# Patient Record
Sex: Female | Born: 1990
Health system: Southern US, Community
[De-identification: ages and names within clinical notes are randomized; demographics above are authoritative.]

## PROBLEM LIST (undated history)

## (undated) DIAGNOSIS — F419 Anxiety disorder, unspecified: Secondary | ICD-10-CM

## (undated) DIAGNOSIS — M1990 Unspecified osteoarthritis, unspecified site: Secondary | ICD-10-CM

## (undated) DIAGNOSIS — D649 Anemia, unspecified: Secondary | ICD-10-CM

## (undated) HISTORY — PX: EYE SURGERY: SHX253

## (undated) HISTORY — DX: Anemia, unspecified: D64.9

## (undated) HISTORY — DX: Anxiety disorder, unspecified: F41.9

## (undated) HISTORY — DX: Unspecified osteoarthritis, unspecified site: M19.90

## (undated) HISTORY — PX: CYSTECTOMY: SUR359

---

## 2011-03-19 ENCOUNTER — Emergency Department (HOSPITAL_COMMUNITY)
Admission: EM | Admit: 2011-03-19 | Discharge: 2011-03-19 | Disposition: A | Discharge status: Home / Self Care | Payer: 59 | Attending: Emergency Medicine | Admitting: Emergency Medicine

## 2011-03-19 ENCOUNTER — Encounter: Payer: Self-pay | Admitting: Emergency Medicine

## 2011-03-19 DIAGNOSIS — IMO0002 Reserved for concepts with insufficient information to code with codable children: Secondary | ICD-10-CM | POA: Insufficient documentation

## 2011-03-19 DIAGNOSIS — H5710 Ocular pain, unspecified eye: Secondary | ICD-10-CM

## 2011-03-19 DIAGNOSIS — H53149 Visual discomfort, unspecified: Secondary | ICD-10-CM | POA: Insufficient documentation

## 2011-03-19 DIAGNOSIS — F17200 Nicotine dependence, unspecified, uncomplicated: Secondary | ICD-10-CM | POA: Insufficient documentation

## 2011-03-19 MED ORDER — TETRACAINE HCL 0.5 % OP SOLN
2.0000 [drp] | Freq: Once | OPHTHALMIC | Status: AC
  Administered 2011-03-19: 2 [drp] via OPHTHALMIC
  Filled 2011-03-19: qty 2

## 2011-03-19 MED ORDER — HYDROCODONE-ACETAMINOPHEN 5-325 MG PO TABS: 2.0000 | ORAL_TABLET | ORAL | Status: AC | PRN

## 2011-03-19 MED ORDER — FLUORESCEIN SODIUM 1 MG OP STRP
1.0000 | ORAL_STRIP | Freq: Once | OPHTHALMIC | Status: AC
  Administered 2011-03-19: 02:00:00 via OPHTHALMIC
  Filled 2011-03-19 (×2): qty 1

## 2011-03-19 MED ORDER — TOBRAMYCIN 0.3 % OP SOLN
2.0000 [drp] | OPHTHALMIC | Status: DC
  Administered 2011-03-19: 2 [drp] via OPHTHALMIC
  Filled 2011-03-19: qty 5

## 2011-03-19 MED ORDER — HYDROCODONE-ACETAMINOPHEN 5-325 MG PO TABS
1.0000 | ORAL_TABLET | Freq: Once | ORAL | Status: AC
  Administered 2011-03-19: 1 via ORAL
  Filled 2011-03-19: qty 1

## 2011-03-19 NOTE — ED Provider Notes (Signed)
History     CSN: [id number] Arrival date & time: 03/19/2011 12:35 AM   First MD Initiated Contact with Patient 03/19/11 0111      Chief Complaint  Patient presents with  . Eye Injury     HPI  History provided by the patient. Patient presents with complaints of increased left thigh pain for the past 4 days. Patient states that she was playing with her dog who jumped and hit her left eye with paws. Patient reports having slight initial pain but no real symptoms until the following morning when she woke up. Patient had left her contact lens in overnight and removed in the morning after moving it her I had some swelling and pain. Since that time she has had increased redness and pain to the eye. sHe has photophobia with increased tearing. Patient denies purulent discharge or drainage.   History reviewed. No pertinent past medical history.  History reviewed. No pertinent past surgical history.  No family history on file.  History  Substance Use Topics  . Smoking status: Current Everyday Smoker  . Smokeless tobacco: Not on file  . Alcohol Use: Yes    OB History    Grav Para Term Preterm Abortions TAB SAB Ect Mult Living                  Review of Systems  Constitutional: Negative for fever and chills.  Eyes: Positive for photophobia, pain and redness. Negative for visual disturbance.  All other systems reviewed and are negative.    Allergies  Latex  Home Medications  No current outpatient prescriptions on file.  BP 109/60  Pulse 88  Temp(Src) 99.1 F (37.3 C) (Oral)  Resp 19  SpO2 98%  LMP 03/15/2011  Physical Exam  Nursing note and vitals reviewed. Constitutional: She is oriented to person, place, and time. She appears well-developed and well-nourished. No distress.  HENT:  Head: Normocephalic.  Eyes: Pupils are equal, round, and reactive to light. Left eye exhibits no chemosis. No foreign body present in the left eye. Left conjunctiva is injected. Left  conjunctiva has no hemorrhage. Right eye exhibits normal extraocular motion and no nystagmus. Left eye exhibits normal extraocular motion and no nystagmus.  Slit lamp exam:      The right eye shows no fluorescein uptake.       The left eye shows no corneal abrasion, no corneal ulcer and no hyphema.  Cardiovascular: Normal rate, regular rhythm and normal heart sounds.   Pulmonary/Chest: Effort normal.  Neurological: She is alert and oriented to person, place, and time.  Psychiatric: She has a normal mood and affect. Her behavior is normal.    ED Course  Procedures (including critical care time)    1. Eye pain       MDM  1:20 AM patient seen and evaluated. Patient in no acute distress.        Angus Seller, [place] 03/19/11 [phone]

## 2011-03-19 NOTE — ED Notes (Signed)
MD at bedside. 

## 2011-03-19 NOTE — ED Notes (Signed)
Family at bedside. 

## 2011-03-19 NOTE — ED Provider Notes (Signed)
Medical screening examination/treatment/procedure(s) were performed by non-physician practitioner and as supervising physician I was immediately available for consultation/collaboration.   Hanley Seamen, MD 03/19/11 [phone]

## 2011-03-19 NOTE — ED Notes (Signed)
PT. REPORTS PROGRESSING  LEFT EYE PAIN WITH REDDNESS / TEARY / SWELLING - SCRATCHED BY HER DOG 4 DAYS AGO.

## 2015-08-31 DIAGNOSIS — J029 Acute pharyngitis, unspecified: Secondary | ICD-10-CM | POA: Insufficient documentation

## 2015-08-31 DIAGNOSIS — M67439 Ganglion, unspecified wrist: Secondary | ICD-10-CM | POA: Insufficient documentation

## 2015-09-10 DIAGNOSIS — Z3040 Encounter for surveillance of contraceptives, unspecified: Secondary | ICD-10-CM | POA: Insufficient documentation

## 2015-09-10 DIAGNOSIS — Z0000 Encounter for general adult medical examination without abnormal findings: Secondary | ICD-10-CM | POA: Insufficient documentation

## 2015-09-10 DIAGNOSIS — Z3202 Encounter for pregnancy test, result negative: Secondary | ICD-10-CM | POA: Insufficient documentation

## 2015-09-11 DIAGNOSIS — E559 Vitamin D deficiency, unspecified: Secondary | ICD-10-CM | POA: Insufficient documentation

## 2016-06-29 ENCOUNTER — Emergency Department (HOSPITAL_COMMUNITY)
Admission: EM | Admit: 2016-06-29 | Discharge: 2016-06-29 | Disposition: A | Discharge status: Home / Self Care | Payer: Self-pay | Attending: Emergency Medicine | Admitting: Emergency Medicine

## 2016-06-29 ENCOUNTER — Encounter (HOSPITAL_COMMUNITY): Payer: Self-pay | Admitting: Emergency Medicine

## 2016-06-29 ENCOUNTER — Emergency Department (HOSPITAL_COMMUNITY): Payer: Self-pay

## 2016-06-29 ENCOUNTER — Ambulatory Visit (HOSPITAL_COMMUNITY)
Admission: EM | Admit: 2016-06-29 | Discharge: 2016-06-29 | Disposition: A | Discharge status: Nursing Home (Includes ICF) | Payer: Self-pay | Attending: Emergency Medicine | Admitting: Emergency Medicine

## 2016-06-29 DIAGNOSIS — R1031 Right lower quadrant pain: Secondary | ICD-10-CM | POA: Insufficient documentation

## 2016-06-29 DIAGNOSIS — R109 Unspecified abdominal pain: Secondary | ICD-10-CM | POA: Insufficient documentation

## 2016-06-29 DIAGNOSIS — R509 Fever, unspecified: Secondary | ICD-10-CM | POA: Insufficient documentation

## 2016-06-29 DIAGNOSIS — R1013 Epigastric pain: Secondary | ICD-10-CM | POA: Insufficient documentation

## 2016-06-29 DIAGNOSIS — R1011 Right upper quadrant pain: Secondary | ICD-10-CM | POA: Insufficient documentation

## 2016-06-29 DIAGNOSIS — R197 Diarrhea, unspecified: Secondary | ICD-10-CM | POA: Insufficient documentation

## 2016-06-29 DIAGNOSIS — R112 Nausea with vomiting, unspecified: Secondary | ICD-10-CM | POA: Insufficient documentation

## 2016-06-29 DIAGNOSIS — F17200 Nicotine dependence, unspecified, uncomplicated: Secondary | ICD-10-CM | POA: Insufficient documentation

## 2016-06-29 DIAGNOSIS — Z3202 Encounter for pregnancy test, result negative: Secondary | ICD-10-CM

## 2016-06-29 DIAGNOSIS — K529 Noninfective gastroenteritis and colitis, unspecified: Secondary | ICD-10-CM | POA: Insufficient documentation

## 2016-06-29 DIAGNOSIS — Z91040 Latex allergy status: Secondary | ICD-10-CM | POA: Insufficient documentation

## 2016-06-29 DIAGNOSIS — R630 Anorexia: Secondary | ICD-10-CM | POA: Insufficient documentation

## 2016-06-29 LAB — URINALYSIS, ROUTINE W REFLEX MICROSCOPIC
Bilirubin Urine: NEGATIVE
GLUCOSE, UA: NEGATIVE mg/dL
Ketones, ur: 5 mg/dL — AB
NITRITE: NEGATIVE
PH: 5 (ref 5.0–8.0)
PROTEIN: NEGATIVE mg/dL
SPECIFIC GRAVITY, URINE: 1.025 (ref 1.005–1.030)

## 2016-06-29 LAB — POCT PREGNANCY, URINE: Preg Test, Ur: NEGATIVE

## 2016-06-29 LAB — COMPREHENSIVE METABOLIC PANEL
ALBUMIN: 4.4 g/dL (ref 3.5–5.0)
ALK PHOS: 45 U/L (ref 38–126)
ALT: 22 U/L (ref 14–54)
AST: 27 U/L (ref 15–41)
Anion gap: 9 (ref 5–15)
BILIRUBIN TOTAL: 1.1 mg/dL (ref 0.3–1.2)
BUN: 15 mg/dL (ref 6–20)
CALCIUM: 9 mg/dL (ref 8.9–10.3)
CO2: 26 mmol/L (ref 22–32)
CREATININE: 0.97 mg/dL (ref 0.44–1.00)
Chloride: 103 mmol/L (ref 101–111)
GFR calc Af Amer: >60 mL/min (ref >60)
GFR calc non Af Amer: >60 mL/min (ref >60)
GLUCOSE: 101 mg/dL — AB (ref 65–99)
Potassium: 3.7 mmol/L (ref 3.5–5.1)
SODIUM: 138 mmol/L (ref 135–145)
TOTAL PROTEIN: 7.9 g/dL (ref 6.5–8.1)

## 2016-06-29 LAB — POCT URINALYSIS DIP (DEVICE)
Glucose, UA: NEGATIVE mg/dL
Ketones, ur: 15 mg/dL — AB
NITRITE: NEGATIVE
Protein, ur: 30 mg/dL — AB
SPECIFIC GRAVITY, URINE: 1.025 (ref 1.005–1.030)
UROBILINOGEN UA: 0.2 mg/dL (ref 0.0–1.0)
pH: 6 (ref 5.0–8.0)

## 2016-06-29 LAB — CBC
HCT: 41.9 % (ref 36.0–46.0)
Hemoglobin: 14 g/dL (ref 12.0–15.0)
MCH: 27.9 pg (ref 26.0–34.0)
MCHC: 33.4 g/dL (ref 30.0–36.0)
MCV: 83.5 fL (ref 78.0–100.0)
PLATELETS: 242 10*3/uL (ref 150–400)
RBC: 5.02 MIL/uL (ref 3.87–5.11)
RDW: 14.3 % (ref 11.5–15.5)
WBC: 6.4 10*3/uL (ref 4.0–10.5)

## 2016-06-29 LAB — LIPASE, BLOOD: Lipase: 16 U/L (ref 11–51)

## 2016-06-29 LAB — POC URINE PREG, ED: Preg Test, Ur: NEGATIVE

## 2016-06-29 MED ORDER — ONDANSETRON HCL 4 MG/2ML IJ SOLN
4.0000 mg | Freq: Once | INTRAMUSCULAR | Status: AC
  Administered 2016-06-29: 4 mg via INTRAMUSCULAR

## 2016-06-29 MED ORDER — IOPAMIDOL (ISOVUE-300) INJECTION 61%
INTRAVENOUS | Status: AC
  Administered 2016-06-29: 100 mL
  Filled 2016-06-29: qty 100

## 2016-06-29 MED ORDER — MORPHINE SULFATE (PF) 4 MG/ML IV SOLN
4.0000 mg | Freq: Once | INTRAVENOUS | Status: AC
  Administered 2016-06-29: 4 mg via INTRAVENOUS
  Filled 2016-06-29: qty 1

## 2016-06-29 MED ORDER — ONDANSETRON HCL 4 MG PO TABS: 4.0000 mg | ORAL_TABLET | Freq: Four times a day (QID) | ORAL | 0 refills | Status: DC | PRN

## 2016-06-29 MED ORDER — SODIUM CHLORIDE 0.9 % IV BOLUS (SEPSIS)
1000.0000 mL | Freq: Once | INTRAVENOUS | Status: AC
  Administered 2016-06-29: 1000 mL via INTRAVENOUS

## 2016-06-29 MED ORDER — ONDANSETRON HCL 4 MG/2ML IJ SOLN
INTRAMUSCULAR | Status: AC
  Filled 2016-06-29: qty 2

## 2016-06-29 MED ORDER — ONDANSETRON HCL 4 MG/2ML IJ SOLN
4.0000 mg | Freq: Once | INTRAMUSCULAR | Status: AC
  Administered 2016-06-29: 4 mg via INTRAVENOUS
  Filled 2016-06-29: qty 2

## 2016-06-29 MED ORDER — HYDROCODONE-ACETAMINOPHEN 5-325 MG PO TABS: 1.0000 | ORAL_TABLET | Freq: Four times a day (QID) | ORAL | 0 refills | Status: DC | PRN

## 2016-06-29 NOTE — ED Triage Notes (Signed)
Rt side abd n/v  X 4 days diarrhea also

## 2016-06-29 NOTE — Discharge Instructions (Signed)
Please take nausea medication as prescribed. Drink plenty of fluids and get plenty of rest.   Please return without fail for worsening symptoms, including intractable vomiting, confusion, passing out, persistent fever, or any other symptoms concerning to you

## 2016-06-29 NOTE — ED Notes (Signed)
Patient transported to CT 

## 2016-06-29 NOTE — Discharge Instructions (Signed)
Due to the location of your abdominal pain in the lower right quadrant, along with pain with straight leg raise, I recommend you go to the emergency room as soon as possible for evaluation. I believe you need imaging and we do not have CT or ultrasound.

## 2016-06-29 NOTE — ED Provider Notes (Signed)
[facility] Provider Note   CSN: [id number] Arrival date & time: 06/29/16  1435  By signing my name below, I, Linna Darner, attest that this documentation has been prepared under the direction and in the presence of physician practitioner, Lavera Guise, MD. Electronically Signed: Linna Darner, Scribe. 06/29/2016. 5:23 PM.  History   Chief Complaint Chief Complaint  Patient presents with  . Abdominal Pain  . Emesis    The history is provided by the patient. No language interpreter was used.     HPI Comments: Mallory Nelson is a 26 y.o. female who presents to the Emergency Department complaining of persistent nausea, vomiting, and diarrhea beginning yesterday. She states she ate [place] food around 2 PM yesterday afternoon and developed pressure-like abdominal pain and lower back pain shortly thereafter. Pt reports her abdominal pain has been constant since onset and is most significant on the right side of her abdomen. She states her N/V/D began after her abdominal pain presented and she has been unable to tolerate any PO intake. Pt reports she measured a temperature of 101 yesterday but has not measured an elevated temperature today. No alleviating factors noted. She states a friend who ate with her yesterday afternoon has not experienced any similar symptoms. She started Depo injections in December 2017 and had her second injection on 06/11/16; she states she has been menstruating intermittently for one month and has had some urinary frequency during this time. Pt notes her current abdominal pain is dissimilar to menstrual cramps. Patient was evaluated at an Urgent Care earlier today for the same and was advised to come to the ED for imaging of her abdomen. She denies vaginal discharge, dysuria, or any other associated symptoms.   History reviewed. No pertinent past medical history.  There are no active problems to display for this patient.   History reviewed. No pertinent surgical  history.  OB History    No data available       Home Medications    Prior to Admission medications   Medication Sig Start Date End Date Taking? Authorizing Provider  HYDROcodone-acetaminophen (NORCO/VICODIN) 5-325 MG tablet Take 1 tablet by mouth every 6 (six) hours as needed for moderate pain or severe pain. 06/29/16   Lavera Guise, MD  ondansetron (ZOFRAN) 4 MG tablet Take 1 tablet (4 mg total) by mouth every 6 (six) hours as needed for nausea or vomiting. 06/29/16   Lavera Guise, MD    Family History No family history on file.  Social History Social History  Substance Use Topics  . Smoking status: Current Every Day Smoker  . Smokeless tobacco: Former Neurosurgeon  . Alcohol use Yes     Allergies   Latex   Review of Systems Review of Systems  10/14 systems reviewed and are negative other than those stated in the HPI  Physical Exam Updated Vital Signs BP 105/73   Pulse (!) 104   Temp 99 F (37.2 C) (Oral)   Resp 16   SpO2 100%   Physical Exam Physical Exam  Nursing note and vitals reviewed. Constitutional: Well developed, well nourished, non-toxic, and in no acute distress Head: Normocephalic and atraumatic.  Mouth/Throat: Oropharynx is clear and moist.  Neck: Normal range of motion. Neck supple.  Cardiovascular: Normal rate and regular rhythm.   Pulmonary/Chest: Effort normal and breath sounds normal.  Abdominal: Soft. There is right-sided abdominal tenderness that is worse in the lower quadrant. There is no rebound and no guarding.  Musculoskeletal: Normal range  of motion.  Neurological: Alert, no facial droop, fluent speech, moves all extremities symmetrically Skin: Skin is warm and dry.  Psychiatric: Cooperative  ED Treatments / Results  Labs (all labs ordered are listed, but only abnormal results are displayed) Labs Reviewed  COMPREHENSIVE METABOLIC PANEL - Abnormal; Notable for the following:       Result Value   Glucose, Bld 101 (*)    All other  components within normal limits  URINALYSIS, ROUTINE W REFLEX MICROSCOPIC - Abnormal; Notable for the following:    APPearance HAZY (*)    Hgb urine dipstick LARGE (*)    Ketones, ur 5 (*)    Leukocytes, UA MODERATE (*)    Bacteria, UA RARE (*)    Squamous Epithelial / LPF 0-5 (*)    All other components within normal limits  URINE CULTURE  LIPASE, BLOOD  CBC  POC URINE PREG, ED    EKG  EKG Interpretation None       Radiology Ct Abdomen Pelvis W Contrast  Result Date: 06/29/2016 CLINICAL DATA:  Right-sided abdominal pain with nausea, vomiting, and diarrhea for 4 days. EXAM: CT ABDOMEN AND PELVIS WITH CONTRAST TECHNIQUE: Multidetector CT imaging of the abdomen and pelvis was performed using the standard protocol following bolus administration of intravenous contrast. CONTRAST:  ISOVUE-300 IOPAMIDOL (ISOVUE-300) INJECTION 61% COMPARISON:  None. FINDINGS: Lower chest: The visualized lung bases are clear. Hepatobiliary: No focal liver abnormality is seen. No gallstones, gallbladder wall thickening, or biliary dilatation. Pancreas: Unremarkable. Spleen: Unremarkable. Adrenals/Urinary Tract: Unremarkable adrenal glands. Subcentimeter low-density lesion in the upper pole of the left kidney, too small to fully characterize. No evidence of renal calculi or hydronephrosis. Stomach/Bowel: The stomach is within normal limits. There are multiple fluid-filled small bowel loops in the pelvis without gross bowel wall thickening though with possible mild hyperemia. There is no bowel dilatation to suggest obstruction. Gas and a small amount of liquid stool are present in the colon, which is nondilated. The appendix is unremarkable. Vascular/Lymphatic: No significant vascular findings are present. No enlarged abdominal or pelvic lymph nodes. Reproductive: Unremarkable uterus. Ovaries not well seen due to unopacified bowel loops throughout the pelvis. Other: No intraperitoneal free fluid. No abdominal wall  mass or hernia. Musculoskeletal: No acute osseous abnormality or suspicious osseous lesion. IMPRESSION: 1. Fluid-filled bowel loops in the pelvis, possibly reflecting mild changes of enteritis. No obstruction. 2. No other evidence of acute abnormality. Electronically Signed   By: Sebastian Ache M.D.   On: 06/29/2016 19:31    Procedures Procedures (including critical care time)  DIAGNOSTIC STUDIES: Oxygen Saturation is 100% on RA, normal by my interpretation.    COORDINATION OF CARE: 5:31 PM Discussed treatment plan with pt at bedside and pt agreed to plan.  Medications Ordered in ED Medications  sodium chloride 0.9 % bolus 1,000 mL (1,000 mLs Intravenous New Bag/Given 06/29/16 1809)  ondansetron (ZOFRAN) injection 4 mg (4 mg Intravenous Given 06/29/16 1805)  morphine 4 MG/ML injection 4 mg (4 mg Intravenous Given 06/29/16 1807)  iopamidol (ISOVUE-300) 61 % injection (100 mLs  Contrast Given 06/29/16 1851)     Initial Impression / Assessment and Plan / ED Course  I have reviewed the triage vital signs and the nursing notes.  Pertinent labs & imaging results that were available during my care of the patient were reviewed by me and considered in my medical decision making (see chart for details).     26 year old female who presents with nausea, vomiting, diarrhea with right-sided  abdominal pain. She is nontoxic in no acute distress. With soft and non-peritoneal abdomen, but primarily right-sided abdominal tenderness to palpation.   Differential includes appendicitis, colitis, gastroenteritis. Having intermittent vaginal bleeding since Depo shot which is expected, but doubt pelvic processes given the vomiting/diarrhea. Pain not felt to be in her adnexa to suggest ovarian cyst or torsion.   Blood work reassuring. She is not pregnant. UA with blood consistent with her menses, but no acute infection. CT visualized, does not show any appendicitis. There are findings that could be suggestive of  enteritis. Otherwise, no acute findings.  Feeling improved after supportive management with a tight medics, analgesics, and IV fluids. Tolerating by food and fluids without difficulty. We'll discharge home with antiemetics and pain control. Discussed supportive care. Strict return and follow-up instructions reviewed. She expressed understanding of all discharge instructions and felt comfortable with the plan of care.\    Final Clinical Impressions(s) / ED Diagnoses   Final diagnoses:  Nausea vomiting and diarrhea  Enteritis    New Prescriptions New Prescriptions   HYDROCODONE-ACETAMINOPHEN (NORCO/VICODIN) 5-325 MG TABLET    Take 1 tablet by mouth every 6 (six) hours as needed for moderate pain or severe pain.   ONDANSETRON (ZOFRAN) 4 MG TABLET    Take 1 tablet (4 mg total) by mouth every 6 (six) hours as needed for nausea or vomiting.   I personally performed the services described in this documentation, which was scribed in my presence. The recorded information has been reviewed and is accurate.    Lavera Guise, MD 06/29/16 2036

## 2016-06-29 NOTE — ED Provider Notes (Signed)
CSN: [id number]     Arrival date & time 06/29/16  1326 History   None    Chief Complaint  Patient presents with  . Abdominal Cramping   (Consider location/radiation/quality/duration/timing/severity/associated sxs/prior Treatment) 26 year old female presents to clinic with a chief complaint of abdominal pain, abdominal cramping, nausea, vomiting, diarrhea. Last menstrual cycle was in early March reportedly, she is getting Depo shots for control. She's been unable to eat, unable to hold any food down, she has been trying to drink, however has had multiple bouts of vomiting, and diarrhea. She denies any hematochezia or hematemesis, she does complain of weakness and fatigue, and reportedly had a fever yesterday of 101.   The history is provided by the patient.  Abdominal Cramping  This is a new problem. The current episode started 2 days ago. The problem occurs constantly. The problem has been gradually worsening. Associated symptoms include abdominal pain. Pertinent negatives include no chest pain and no shortness of breath. The symptoms are aggravated by bending, eating and drinking. Nothing relieves the symptoms.  Abdominal Pain  Pain location:  RLQ, RUQ and epigastric Pain quality: aching, cramping, sharp and stabbing   Pain radiates to:  Does not radiate Pain severity:  Severe Onset quality:  Gradual Duration:  2 days Timing:  Constant Progression:  Worsening Chronicity:  New Context: not diet changes, not eating, not recent illness, not sick contacts, not suspicious food intake and not trauma   Relieved by:  Nothing Worsened by:  Eating, movement, position changes and vomiting Ineffective treatments:  Lying down and vomiting Associated symptoms: anorexia, chills, diarrhea, fever, nausea and vomiting   Associated symptoms: no chest pain, no constipation, no cough, no dysuria, no hematemesis, no hematochezia, no hematuria, no melena and no shortness of breath     History reviewed. No  pertinent past medical history. History reviewed. No pertinent surgical history. History reviewed. No pertinent family history. Social History  Substance Use Topics  . Smoking status: Current Every Day Smoker  . Smokeless tobacco: Former Neurosurgeon  . Alcohol use Yes   OB History    No data available     Review of Systems  Constitutional: Positive for chills and fever.  Respiratory: Negative for cough and shortness of breath.   Cardiovascular: Negative for chest pain and palpitations.  Gastrointestinal: Positive for abdominal pain, anorexia, diarrhea, nausea and vomiting. Negative for constipation, hematemesis, hematochezia and melena.  Genitourinary: Negative for dysuria, frequency, hematuria and urgency.  Musculoskeletal: Positive for back pain. Negative for neck pain and neck stiffness.  Neurological: Positive for weakness. Negative for light-headedness and numbness.  All other systems reviewed and are negative.   Allergies  Latex  Home Medications   Prior to Admission medications   Not on File   Meds Ordered and Administered this Visit   Medications  ondansetron (ZOFRAN) injection 4 mg (4 mg Intramuscular Given 06/29/16 1422)    BP 110/74 (BP Location: Right Arm)   Pulse 95   Temp 98.5 F (36.9 C) (Oral)   Resp 16   SpO2 100%  No data found.   Physical Exam  Constitutional: She is oriented to person, place, and time. She appears well-developed and well-nourished. No distress.  HENT:  Head: Normocephalic and atraumatic.  Cardiovascular: Normal rate and regular rhythm.   Pulmonary/Chest: Effort normal and breath sounds normal.  Abdominal: Soft. Normal appearance and bowel sounds are normal. She exhibits no distension. There is no hepatosplenomegaly. There is tenderness in the right upper quadrant, right lower quadrant  and epigastric area. There is no rigidity, no rebound and no guarding.  Pain worsened with straight leg raise  Neurological: She is alert and oriented  to person, place, and time.  Skin: Skin is warm and dry. Capillary refill takes less than 2 seconds. She is not diaphoretic.  Psychiatric: She has a normal mood and affect.  Nursing note and vitals reviewed.   Urgent Care Course     Procedures (including critical care time)  Labs Review Labs Reviewed  POCT URINALYSIS DIP (DEVICE) - Abnormal; Notable for the following:       Result Value   Bilirubin Urine SMALL (*)    Ketones, ur 15 (*)    Hgb urine dipstick LARGE (*)    Protein, ur 30 (*)    Leukocytes, UA TRACE (*)    All other components within normal limits  URINE CULTURE  POCT PREGNANCY, URINE    Imaging Review No results found.     MDM   1. Right lower quadrant abdominal pain    Urine pregnancy test negative, UA significant for leukocytes, hemoglobin, protein, ketones, and bilirubin. Patient denies she is on her period. Recommended further treatment and evaluation in the emergency room based on abdominal exam.      Dorena Bodo, NP 06/29/16 [phone]

## 2016-06-29 NOTE — ED Triage Notes (Signed)
The patient presented to the [facility] with a complaint of abdominal pain with N/V/D x 2 days.

## 2016-06-30 LAB — URINE CULTURE
Culture: 30000 — AB
Culture: NO GROWTH

## 2016-12-09 ENCOUNTER — Ambulatory Visit (HOSPITAL_COMMUNITY)
Admission: EM | Admit: 2016-12-09 | Discharge: 2016-12-09 | Disposition: A | Discharge status: Home / Self Care | Payer: Managed Care, Other (non HMO) | Attending: Family Medicine | Admitting: Family Medicine

## 2016-12-09 ENCOUNTER — Encounter (HOSPITAL_COMMUNITY): Payer: Self-pay | Admitting: Emergency Medicine

## 2016-12-09 DIAGNOSIS — J039 Acute tonsillitis, unspecified: Secondary | ICD-10-CM

## 2016-12-09 MED ORDER — AMOXICILLIN 500 MG PO CAPS: 1000.0000 mg | ORAL_CAPSULE | Freq: Two times a day (BID) | ORAL | 0 refills | Status: DC

## 2016-12-09 NOTE — Discharge Instructions (Signed)
Take the amoxicillin as directed. Ibuprofen 600 mg every 6 hours, Tylenol every 4 hours as needed. Try to drink plenty fluids and stay well-hydrated. This will help with sore throat pain along with Cepacol lozenges and Chloraseptic spray.

## 2016-12-09 NOTE — ED Provider Notes (Signed)
[facility]    CSN: [id number] Arrival date & time: 12/09/16  1527     History   Chief Complaint Chief Complaint  Patient presents with  . Sore Throat    HPI Mallory DunningMonsha Nelson is a 26 y.o. female.   26 year old female complaining of a painful sore throat for about 3 days. It is worse with swallowing drink and food. She says is worse on the left than the right. Denies fever or chills.      History reviewed. No pertinent past medical history.  There are no active problems to display for this patient.   History reviewed. No pertinent surgical history.  OB History    No data available       Home Medications    Prior to Admission medications   Medication Sig Start Date End Date Taking? Authorizing Provider  amoxicillin (AMOXIL) 500 MG capsule Take 2 capsules (1,000 mg total) by mouth 2 (two) times daily. 12/09/16   Hayden RasmussenMabe, , NP  HYDROcodone-acetaminophen (NORCO/VICODIN) 5-325 MG tablet Take 1 tablet by mouth every 6 (six) hours as needed for moderate pain or severe pain. 06/29/16   Lavera GuiseLiu, Dana Duo, MD  ondansetron (ZOFRAN) 4 MG tablet Take 1 tablet (4 mg total) by mouth every 6 (six) hours as needed for nausea or vomiting. 06/29/16   Lavera GuiseLiu, Dana Duo, MD    Family History History reviewed. No pertinent family history.  Social History Social History  Substance Use Topics  . Smoking status: Current Every Day Smoker  . Smokeless tobacco: Former NeurosurgeonUser  . Alcohol use Yes     Allergies   Latex   Review of Systems Review of Systems  Constitutional: Negative.   HENT: Positive for postnasal drip and sore throat. Negative for congestion.   Respiratory: Negative.   Gastrointestinal: Negative.   All other systems reviewed and are negative.    Physical Exam Triage Vital Signs ED Triage Vitals  Enc Vitals Group     BP 12/09/16 1553 106/73     Pulse Rate 12/09/16 1553 100     Resp --      Temp 12/09/16 1553 98.3 F (36.8 C)     Temp Source 12/09/16 1553  Oral     SpO2 12/09/16 1553 100 %     Weight --      Height --      Head Circumference --      Peak Flow --      Pain Score 12/09/16 1554 8     Pain Loc --      Pain Edu? --      Excl. in GC? --    No data found.   Updated Vital Signs BP 106/73 (BP Location: Left Arm)   Pulse 100   Temp 98.3 F (36.8 C) (Oral)   SpO2 100%   Visual Acuity Right Eye Distance:   Left Eye Distance:   Bilateral Distance:    Right Eye Near:   Left Eye Near:    Bilateral Near:     Physical Exam  Constitutional: She is oriented to person, place, and time. She appears well-developed and well-nourished. No distress.  HENT:  Oropharynx with erythema, small palatine tonsils left larger than right both erythematous with exudate  Eyes: EOM are normal.  Neck: Normal range of motion. Neck supple.  Cardiovascular: Normal rate and regular rhythm.   Pulmonary/Chest: Effort normal.  Musculoskeletal: Normal range of motion.  Neurological: She is alert and oriented to person, place, and time.  Skin: Skin is warm and dry.  Nursing note and vitals reviewed.    UC Treatments / Results  Labs (all labs ordered are listed, but only abnormal results are displayed) Labs Reviewed - No data to display  EKG  EKG Interpretation None       Radiology No results found.  Procedures Procedures (including critical care time)  Medications Ordered in UC Medications - No data to display   Initial Impression / Assessment and Plan / UC Course  I have reviewed the triage vital signs and the nursing notes.  Pertinent labs & imaging results that were available during my care of the patient were reviewed by me and considered in my medical decision making (see chart for details).    Take the amoxicillin as directed. Ibuprofen 600 mg every 6 hours, Tylenol every 4 hours as needed. Try to drink plenty fluids and stay well-hydrated. This will help with sore throat pain along with Cepacol lozenges and Chloraseptic  spray.     Final Clinical Impressions(s) / UC Diagnoses   Final diagnoses:  Exudative tonsillitis    New Prescriptions New Prescriptions   AMOXICILLIN (AMOXIL) 500 MG CAPSULE    Take 2 capsules (1,000 mg total) by mouth 2 (two) times daily.     Controlled Substance Prescriptions [place] Controlled Substance Registry consulted? Not Applicable   Hayden Rasmussen, NP 12/09/16 1630

## 2016-12-09 NOTE — ED Triage Notes (Signed)
Pt reports a sore throat for the last three days.  She states her lymph nodes are swollen, she is unable to eat or drink anything because of the pain with swallowing.

## 2018-04-28 ENCOUNTER — Other Ambulatory Visit: Payer: Self-pay

## 2018-04-28 ENCOUNTER — Emergency Department (HOSPITAL_COMMUNITY)
Admission: EM | Admit: 2018-04-28 | Discharge: 2018-04-29 | Disposition: A | Discharge status: Home / Self Care | Payer: BLUE CROSS/BLUE SHIELD | Attending: Emergency Medicine | Admitting: Emergency Medicine

## 2018-04-28 ENCOUNTER — Encounter (HOSPITAL_COMMUNITY): Payer: Self-pay | Admitting: Emergency Medicine

## 2018-04-28 DIAGNOSIS — S0502XA Injury of conjunctiva and corneal abrasion without foreign body, left eye, initial encounter: Secondary | ICD-10-CM | POA: Insufficient documentation

## 2018-04-28 DIAGNOSIS — T1592XA Foreign body on external eye, part unspecified, left eye, initial encounter: Secondary | ICD-10-CM

## 2018-04-28 DIAGNOSIS — Y92524 Gas station as the place of occurrence of the external cause: Secondary | ICD-10-CM | POA: Insufficient documentation

## 2018-04-28 DIAGNOSIS — Z79899 Other long term (current) drug therapy: Secondary | ICD-10-CM | POA: Diagnosis not present

## 2018-04-28 DIAGNOSIS — F17210 Nicotine dependence, cigarettes, uncomplicated: Secondary | ICD-10-CM | POA: Insufficient documentation

## 2018-04-28 DIAGNOSIS — Y939 Activity, unspecified: Secondary | ICD-10-CM | POA: Diagnosis not present

## 2018-04-28 DIAGNOSIS — W25XXXA Contact with sharp glass, initial encounter: Secondary | ICD-10-CM | POA: Insufficient documentation

## 2018-04-28 DIAGNOSIS — Y999 Unspecified external cause status: Secondary | ICD-10-CM | POA: Diagnosis not present

## 2018-04-28 DIAGNOSIS — H1132 Conjunctival hemorrhage, left eye: Secondary | ICD-10-CM

## 2018-04-28 MED ORDER — TETRACAINE HCL 0.5 % OP SOLN
2.0000 [drp] | Freq: Once | OPHTHALMIC | Status: AC
  Administered 2018-04-28: 2 [drp] via OPHTHALMIC
  Filled 2018-04-28: qty 4

## 2018-04-28 MED ORDER — OXYCODONE-ACETAMINOPHEN 5-325 MG PO TABS
1.0000 | ORAL_TABLET | Freq: Once | ORAL | Status: AC
  Administered 2018-04-28: 1 via ORAL
  Filled 2018-04-28: qty 1

## 2018-04-28 MED ORDER — FLUORESCEIN SODIUM 1 MG OP STRP
1.0000 | ORAL_STRIP | Freq: Once | OPHTHALMIC | Status: AC
  Administered 2018-04-28: 1 via OPHTHALMIC
  Filled 2018-04-28: qty 1
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
## 2018-04-29 ENCOUNTER — Emergency Department (HOSPITAL_COMMUNITY): Payer: BLUE CROSS/BLUE SHIELD

## 2018-04-29 MED ORDER — OXYCODONE-ACETAMINOPHEN 5-325 MG PO TABS: 1.0000 | ORAL_TABLET | Freq: Three times a day (TID) | ORAL | 0 refills | Status: DC | PRN

## 2018-04-29 MED ORDER — ERYTHROMYCIN 5 MG/GM OP OINT
1.0000 "application " | TOPICAL_OINTMENT | Freq: Once | OPHTHALMIC | Status: AC
  Administered 2018-04-29: 1 via OPHTHALMIC
  Filled 2018-04-29: qty 3.5

## 2018-04-29 NOTE — Progress Notes (Signed)
Attempted to call pt x 2 for pre-op call. Each time got her voicemail. Left pre-op instructions on pt's voicemail. I also called and spoke with pt's mother, Lesia Sago asking her to have pt listen to voicemail. She states she would make sure pt did so.

## 2018-04-29 NOTE — Anesthesia Preprocedure Evaluation (Addendum)
Anesthesia Evaluation  Patient identified by MRN, date of birth, ID band Patient awake    Reviewed: Allergy & Precautions, H&P , NPO status , Patient's Chart, lab work & pertinent test results, reviewed documented beta blocker date and time   Airway Mallampati: I  TM Distance: >3 FB Neck ROM: full    Dental no notable dental hx. (+) Teeth Intact   Pulmonary neg pulmonary ROS, Current Smoker,    Pulmonary exam normal breath sounds clear to auscultation       Cardiovascular Exercise Tolerance: Good negative cardio ROS   Rhythm:regular Rate:Normal     Neuro/Psych negative neurological ROS  negative psych ROS   GI/Hepatic negative GI ROS, Neg liver ROS,   Endo/Other  negative endocrine ROS  Renal/GU negative Renal ROS  negative genitourinary   Musculoskeletal   Abdominal   Peds  Hematology negative hematology ROS (+)   Anesthesia Other Findings   Reproductive/Obstetrics negative OB ROS                            Anesthesia Physical Anesthesia Plan  ASA: II  Anesthesia Plan: General   Post-op Pain Management:    Induction: Intravenous  PONV Risk Score and Plan: 3 and Ondansetron and Treatment may vary due to age or medical condition  Airway Management Planned: Oral ETT  Additional Equipment:   Intra-op Plan:   Post-operative Plan: Extubation in OR  Informed Consent: I have reviewed the patients History and Physical, chart, labs and discussed the procedure including the risks, benefits and alternatives for the proposed anesthesia with the patient or authorized representative who has indicated his/her understanding and acceptance.     Dental Advisory Given  Plan Discussed with: CRNA, Anesthesiologist and Surgeon  Anesthesia Plan Comments: (  )        Anesthesia Quick Evaluation

## 2018-04-29 NOTE — Discharge Instructions (Addendum)
Thank you for allowing me to care for you today in the Emergency Department.   Apply a 0.5 inch ribbon of erythromycin ointment to the lower eyelashes every 6 hours while you are awake until Dr. Allena Katz tells you to stop the medication.  Someone from his office will be calling you tomorrow to set up a time to get the glass removed from your eye.  For mild to moderate pain, take 600 mg of ibuprofen with food or 650 mg of Tylenol once every 6 hours.  You can alternate between these 2 medications every 3 hours.  For severe pain, take 1 tablet of Percocet every 8 hours.  This is a narcotic and can be addicting.  Please only use for severe pain.  Please do not work, drive, or take other substances that may make you sleepy while you are taking this medication.  I strongly encourage you not to go to work tomorrow since you have an open cut on your eye to prevent infection.  However, if it is unavoidable, please wear the patch on your eye that you were provided tonight in the ER.  Return to the emergency department if you develop thick, mucus-like drainage from the eye, if you lose vision in the eye, if you have a new or worsening injury, develop a high fever, or other new, concerning symptoms.

## 2018-04-30 ENCOUNTER — Other Ambulatory Visit: Payer: Self-pay

## 2018-04-30 ENCOUNTER — Ambulatory Visit (HOSPITAL_COMMUNITY)
Admission: RE | Admit: 2018-04-30 | Discharge: 2018-04-30 | Disposition: A | Discharge status: Home / Self Care | Payer: BLUE CROSS/BLUE SHIELD | Attending: Ophthalmology | Admitting: Ophthalmology

## 2018-04-30 ENCOUNTER — Encounter (HOSPITAL_COMMUNITY)
Admission: RE | Disposition: A | Discharge status: Home / Self Care | Payer: Self-pay | Source: Home / Self Care | Attending: Ophthalmology

## 2018-04-30 ENCOUNTER — Ambulatory Visit (HOSPITAL_COMMUNITY): Payer: BLUE CROSS/BLUE SHIELD | Admitting: Anesthesiology

## 2018-04-30 ENCOUNTER — Encounter (HOSPITAL_COMMUNITY): Payer: Self-pay

## 2018-04-30 DIAGNOSIS — W25XXXA Contact with sharp glass, initial encounter: Secondary | ICD-10-CM | POA: Diagnosis not present

## 2018-04-30 DIAGNOSIS — S0532XA Ocular laceration without prolapse or loss of intraocular tissue, left eye, initial encounter: Secondary | ICD-10-CM | POA: Diagnosis not present

## 2018-04-30 DIAGNOSIS — F17200 Nicotine dependence, unspecified, uncomplicated: Secondary | ICD-10-CM | POA: Insufficient documentation

## 2018-04-30 DIAGNOSIS — T1592XA Foreign body on external eye, part unspecified, left eye, initial encounter: Secondary | ICD-10-CM | POA: Insufficient documentation

## 2018-04-30 HISTORY — PX: EYE EXAMINATION UNDER ANESTHESIA: SHX1560

## 2018-04-30 LAB — CBC
HCT: 41.5 % (ref 36.0–46.0)
Hemoglobin: 12.9 g/dL (ref 12.0–15.0)
MCH: 26.9 pg (ref 26.0–34.0)
MCHC: 31.1 g/dL (ref 30.0–36.0)
MCV: 86.6 fL (ref 80.0–100.0)
Platelets: 235 10*3/uL (ref 150–400)
RBC: 4.79 MIL/uL (ref 3.87–5.11)
RDW: 14.3 % (ref 11.5–15.5)
WBC: 5.4 10*3/uL (ref 4.0–10.5)
nRBC: 0 % (ref 0.0–0.2)

## 2018-04-30 LAB — COMPREHENSIVE METABOLIC PANEL
ALT: 15 U/L (ref 0–44)
AST: 17 U/L (ref 15–41)
Albumin: 4.1 g/dL (ref 3.5–5.0)
Alkaline Phosphatase: 46 U/L (ref 38–126)
Anion gap: 10 (ref 5–15)
BUN: 14 mg/dL (ref 6–20)
CO2: 25 mmol/L (ref 22–32)
Calcium: 9 mg/dL (ref 8.9–10.3)
Chloride: 106 mmol/L (ref 98–111)
Creatinine, Ser: 0.88 mg/dL (ref 0.44–1.00)
Glucose, Bld: 105 mg/dL — ABNORMAL HIGH (ref 70–99)
Potassium: 4.4 mmol/L (ref 3.5–5.1)
Sodium: 141 mmol/L (ref 135–145)
Total Bilirubin: 0.5 mg/dL (ref 0.3–1.2)
Total Protein: 7.2 g/dL (ref 6.5–8.1)

## 2018-04-30 LAB — POCT PREGNANCY, URINE: Preg Test, Ur: NEGATIVE

## 2018-04-30 SURGERY — EXAM UNDER ANESTHESIA, EYE
Anesthesia: General | Site: Eye | Laterality: Bilateral

## 2018-04-30 MED ORDER — OXYCODONE HCL 5 MG PO TABS
ORAL_TABLET | ORAL | Status: AC
  Filled 2018-04-30: qty 1

## 2018-04-30 MED ORDER — FENTANYL CITRATE (PF) 250 MCG/5ML IJ SOLN
INTRAMUSCULAR | Status: DC | PRN
  Administered 2018-04-30 (×2): 50 ug via INTRAVENOUS
  Administered 2018-04-30: 25 ug via INTRAVENOUS

## 2018-04-30 MED ORDER — DEXAMETHASONE SODIUM PHOSPHATE 10 MG/ML IJ SOLN
INTRAMUSCULAR | Status: AC
  Filled 2018-04-30: qty 1

## 2018-04-30 MED ORDER — BSS IO SOLN
INTRAOCULAR | Status: AC
  Filled 2018-04-30: qty 15

## 2018-04-30 MED ORDER — BSS PLUS IO SOLN
INTRAOCULAR | Status: AC
  Filled 2018-04-30: qty 500

## 2018-04-30 MED ORDER — OXYCODONE HCL 5 MG/5ML PO SOLN: 5.0000 mg | Freq: Once | ORAL | Status: AC | PRN

## 2018-04-30 MED ORDER — MIDAZOLAM HCL 2 MG/2ML IJ SOLN
INTRAMUSCULAR | Status: DC | PRN
  Administered 2018-04-30: 2 mg via INTRAVENOUS

## 2018-04-30 MED ORDER — MEPERIDINE HCL 50 MG/ML IJ SOLN: 6.2500 mg | INTRAMUSCULAR | Status: DC | PRN

## 2018-04-30 MED ORDER — ACETAMINOPHEN 325 MG PO TABS
ORAL_TABLET | ORAL | Status: AC
  Filled 2018-04-30: qty 2

## 2018-04-30 MED ORDER — LACTATED RINGERS IV SOLN
INTRAVENOUS | Status: DC | PRN
  Administered 2018-04-30: 07:00:00 via INTRAVENOUS

## 2018-04-30 MED ORDER — PHENYLEPHRINE HCL 2.5 % OP SOLN
1.0000 [drp] | OPHTHALMIC | Status: AC | PRN
  Administered 2018-04-30 (×3): 1 [drp] via OPHTHALMIC
  Filled 2018-04-30: qty 2

## 2018-04-30 MED ORDER — ROCURONIUM BROMIDE 50 MG/5ML IV SOSY
PREFILLED_SYRINGE | INTRAVENOUS | Status: AC
  Filled 2018-04-30: qty 5

## 2018-04-30 MED ORDER — DEXAMETHASONE SODIUM PHOSPHATE 10 MG/ML IJ SOLN
INTRAMUSCULAR | Status: DC | PRN
  Administered 2018-04-30: 10 mg

## 2018-04-30 MED ORDER — HYALURONIDASE HUMAN 150 UNIT/ML IJ SOLN
INTRAMUSCULAR | Status: AC
  Filled 2018-04-30: qty 1

## 2018-04-30 MED ORDER — LIDOCAINE HCL 2 % IJ SOLN
INTRAMUSCULAR | Status: AC
  Filled 2018-04-30: qty 20

## 2018-04-30 MED ORDER — FENTANYL CITRATE (PF) 100 MCG/2ML IJ SOLN: 25.0000 ug | INTRAMUSCULAR | Status: DC | PRN

## 2018-04-30 MED ORDER — LIDOCAINE 2% (20 MG/ML) 5 ML SYRINGE
INTRAMUSCULAR | Status: DC | PRN
  Administered 2018-04-30: 60 mg via INTRAVENOUS

## 2018-04-30 MED ORDER — 0.9 % SODIUM CHLORIDE (POUR BTL) OPTIME
TOPICAL | Status: DC | PRN
  Administered 2018-04-30: 200 mL

## 2018-04-30 MED ORDER — ACETAMINOPHEN 160 MG/5ML PO SOLN: 325.0000 mg | ORAL | Status: DC | PRN

## 2018-04-30 MED ORDER — PROPARACAINE HCL 0.5 % OP SOLN
1.0000 [drp] | OPHTHALMIC | Status: AC | PRN
  Administered 2018-04-30 (×3): 1 [drp] via OPHTHALMIC
  Filled 2018-04-30: qty 15

## 2018-04-30 MED ORDER — LIDOCAINE 2% (20 MG/ML) 5 ML SYRINGE
INTRAMUSCULAR | Status: AC
  Filled 2018-04-30: qty 5

## 2018-04-30 MED ORDER — PROPOFOL 10 MG/ML IV BOLUS
INTRAVENOUS | Status: DC | PRN
  Administered 2018-04-30: 160 mg via INTRAVENOUS
  Administered 2018-04-30: 20 mg via INTRAVENOUS

## 2018-04-30 MED ORDER — TETRACAINE HCL 0.5 % OP SOLN
OPHTHALMIC | Status: AC
  Filled 2018-04-30: qty 4

## 2018-04-30 MED ORDER — ONDANSETRON HCL 4 MG/2ML IJ SOLN
INTRAMUSCULAR | Status: DC | PRN
  Administered 2018-04-30: 4 mg via INTRAVENOUS

## 2018-04-30 MED ORDER — TOBRAMYCIN-DEXAMETHASONE 0.3-0.1 % OP OINT
TOPICAL_OINTMENT | OPHTHALMIC | Status: AC
  Filled 2018-04-30: qty 3.5

## 2018-04-30 MED ORDER — OXYCODONE HCL 5 MG PO TABS
5.0000 mg | ORAL_TABLET | Freq: Once | ORAL | Status: AC | PRN
  Administered 2018-04-30: 5 mg via ORAL

## 2018-04-30 MED ORDER — BSS IO SOLN
INTRAOCULAR | Status: DC | PRN
  Administered 2018-04-30: 15 mL via INTRAOCULAR

## 2018-04-30 MED ORDER — ACETAMINOPHEN 325 MG PO TABS
325.0000 mg | ORAL_TABLET | ORAL | Status: DC | PRN
  Administered 2018-04-30: 650 mg via ORAL

## 2018-04-30 MED ORDER — MIDAZOLAM HCL 2 MG/2ML IJ SOLN
INTRAMUSCULAR | Status: AC
  Filled 2018-04-30: qty 2

## 2018-04-30 MED ORDER — PROPOFOL 10 MG/ML IV BOLUS
INTRAVENOUS | Status: AC
  Filled 2018-04-30: qty 20

## 2018-04-30 MED ORDER — OFLOXACIN 0.3 % OP SOLN
1.0000 [drp] | OPHTHALMIC | Status: AC | PRN
  Administered 2018-04-30 (×3): 1 [drp] via OPHTHALMIC
  Filled 2018-04-30: qty 5

## 2018-04-30 MED ORDER — FENTANYL CITRATE (PF) 250 MCG/5ML IJ SOLN
INTRAMUSCULAR | Status: AC
  Filled 2018-04-30: qty 5

## 2018-04-30 MED ORDER — CYCLOPENTOLATE HCL 1 % OP SOLN
1.0000 [drp] | OPHTHALMIC | Status: AC | PRN
  Administered 2018-04-30 (×3): 1 [drp] via OPHTHALMIC
  Filled 2018-04-30: qty 2

## 2018-04-30 MED ORDER — ONDANSETRON HCL 4 MG/2ML IJ SOLN: 4.0000 mg | Freq: Once | INTRAMUSCULAR | Status: DC | PRN

## 2018-04-30 MED ORDER — BUPIVACAINE HCL (PF) 0.75 % IJ SOLN
INTRAMUSCULAR | Status: AC
  Filled 2018-04-30: qty 10

## 2018-04-30 MED ORDER — ATROPINE SULFATE 1 % OP SOLN
OPHTHALMIC | Status: AC
  Filled 2018-04-30: qty 5

## 2018-04-30 MED ORDER — TOBRAMYCIN-DEXAMETHASONE 0.3-0.1 % OP SUSP
OPHTHALMIC | Status: DC | PRN
  Administered 2018-04-30: 2 [drp] via OTIC

## 2018-04-30 MED ORDER — STERILE WATER FOR IRRIGATION IR SOLN
Status: DC | PRN
  Administered 2018-04-30: 200 mL

## 2018-04-30 MED ORDER — TETRACAINE 0.5 % OP SOLN OPTIME - NO CHARGE
OPHTHALMIC | Status: DC | PRN
  Administered 2018-04-30: 2 [drp] via OPHTHALMIC

## 2018-04-30 MED ORDER — DEXAMETHASONE SODIUM PHOSPHATE 10 MG/ML IJ SOLN
INTRAMUSCULAR | Status: DC | PRN
  Administered 2018-04-30: 5 mg via INTRAVENOUS

## 2018-04-30 MED ORDER — LIDOCAINE HCL 2 % IJ SOLN
INTRAMUSCULAR | Status: DC | PRN
  Administered 2018-04-30: 20 mL

## 2018-04-30 MED ORDER — GENTAMICIN SUBCONJUNCTIVAL INJECTION 40 MG / ML KALEIDOSCOPE
INTRAVITREAL | Status: AC
  Administered 2018-04-30: 20 mg via SUBCONJUNCTIVAL
  Filled 2018-04-30: qty 2

## 2018-04-30 MED ORDER — PHENYLEPHRINE 40 MCG/ML (10ML) SYRINGE FOR IV PUSH (FOR BLOOD PRESSURE SUPPORT)
PREFILLED_SYRINGE | INTRAVENOUS | Status: DC | PRN
  Administered 2018-04-30 (×2): 40 ug via INTRAVENOUS

## 2018-04-30 MED ORDER — EPINEPHRINE PF 1 MG/ML IJ SOLN
INTRAMUSCULAR | Status: AC
  Filled 2018-04-30: qty 1

## 2018-04-30 MED ORDER — ONDANSETRON HCL 4 MG/2ML IJ SOLN
INTRAMUSCULAR | Status: AC
  Filled 2018-04-30: qty 2

## 2018-04-30 SURGICAL SUPPLY — 35 items
APPLICATOR DR MATTHEWS STRL (MISCELLANEOUS) IMPLANT
BAG ISOLATION DRAPE 18X18 (DRAPES) IMPLANT
BLADE EYE CATARACT 19 1.4 BEAV (BLADE) IMPLANT
BLADE MVR KNIFE 20G (BLADE) IMPLANT
CAUTERY EYE LOW TEMP 1300F FIN (OPHTHALMIC RELATED) IMPLANT
CLOSURE WOUND 1/2 X4 (GAUZE/BANDAGES/DRESSINGS) ×1
CORDS BIPOLAR (ELECTRODE) IMPLANT
COVER SURGICAL LIGHT HANDLE (MISCELLANEOUS) IMPLANT
COVER WAND RF STERILE (DRAPES) IMPLANT
DRAPE ISOLATION BAG 18X18 (DRAPES)
DRAPE OPHTHALMIC 40X48 W POUCH (DRAPES) ×3 IMPLANT
ERASER HMR WETFIELD 23G BP (MISCELLANEOUS) IMPLANT
GLOVE BIOGEL PI IND STRL 7.0 (GLOVE) ×1 IMPLANT
GLOVE BIOGEL PI IND STRL 8 (GLOVE) ×1 IMPLANT
GLOVE BIOGEL PI INDICATOR 7.0 (GLOVE) ×2
GLOVE BIOGEL PI INDICATOR 8 (GLOVE) ×2
GLOVE ECLIPSE 7.5 STRL STRAW (GLOVE) IMPLANT
GLOVE SURG SS PI 7.0 STRL IVOR (GLOVE) ×3 IMPLANT
GOWN STRL REUS W/ TWL LRG LVL3 (GOWN DISPOSABLE) ×2 IMPLANT
GOWN STRL REUS W/TWL LRG LVL3 (GOWN DISPOSABLE) ×4
KIT BASIN OR (CUSTOM PROCEDURE TRAY) ×3 IMPLANT
LENS BIOM SUPER VIEW SET DISP (OPHTHALMIC RELATED) IMPLANT
NS IRRIG 1000ML POUR BTL (IV SOLUTION) ×3 IMPLANT
PACK CATARACT CUSTOM (CUSTOM PROCEDURE TRAY) ×3 IMPLANT
PAD ARMBOARD 7.5X6 YLW CONV (MISCELLANEOUS) ×3 IMPLANT
ROLLS DENTAL (MISCELLANEOUS) IMPLANT
SOLUTION ANTI FOG 6CC (MISCELLANEOUS) IMPLANT
SPEAR EYE SURG WECK-CEL (MISCELLANEOUS) IMPLANT
SPECIMEN JAR SMALL (MISCELLANEOUS) IMPLANT
STRIP CLOSURE SKIN 1/2X4 (GAUZE/BANDAGES/DRESSINGS) ×2 IMPLANT
SUT CHROMIC 7 0 TG140 8 (SUTURE) ×3 IMPLANT
SUT ETHILON 10 0 CS140 6 (SUTURE) IMPLANT
SUT ETHILON 9 0 TG140 8 (SUTURE) IMPLANT
TOWEL OR 17X24 6PK STRL BLUE (TOWEL DISPOSABLE) ×3 IMPLANT
WATER STERILE IRR 1000ML POUR (IV SOLUTION) ×3 IMPLANT
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
## 2018-04-30 NOTE — Transfer of Care (Signed)
Immediate Anesthesia Transfer of Care Note  Patient: Mallory Nelson  Procedure(s) Performed: GLOBE EXPLORATION , REMOVAL OF FOREIGN BODY (GLASS) (Bilateral )  Patient Location: PACU  Anesthesia Type:General  Level of Consciousness: drowsy  Airway & Oxygen Therapy: Patient Spontanous Breathing and Patient connected to nasal cannula oxygen  Post-op Assessment: Report given to RN and Post -op Vital signs reviewed and stable  Post vital signs: Reviewed and stable  Last Vitals:  Vitals Value Taken Time  BP 94/65 04/30/2018  8:29 AM  Temp    Pulse 91 04/30/2018  8:30 AM  Resp 20 04/30/2018  8:30 AM  SpO2 99 % 04/30/2018  8:30 AM  Vitals shown include unvalidated device data.  Last Pain:  Vitals:   04/30/18 0649  TempSrc:   PainSc: 7          Complications: No apparent anesthesia complications

## 2018-04-30 NOTE — Anesthesia Postprocedure Evaluation (Signed)
Anesthesia Post Note  Patient: Chiropractor  Procedure(s) Performed: GLOBE EXPLORATION , REMOVAL OF FOREIGN BODY (GLASS) (Bilateral Eye)     Patient location during evaluation: PACU Anesthesia Type: General Level of consciousness: awake and alert Pain management: pain level controlled Vital Signs Assessment: post-procedure vital signs reviewed and stable Respiratory status: spontaneous breathing, nonlabored ventilation, respiratory function stable and patient connected to nasal cannula oxygen Cardiovascular status: blood pressure returned to baseline and stable Postop Assessment: no apparent nausea or vomiting Anesthetic complications: no    Last Vitals:  Vitals:   04/30/18 0836 04/30/18 0845  BP: 119/78 117/78  Pulse: 94 82  Resp: 16 15  Temp:    SpO2: 99% 100%    Last Pain:  Vitals:   04/30/18 0830  TempSrc:   PainSc: Asleep                 ,

## 2018-04-30 NOTE — Anesthesia Procedure Notes (Signed)
Procedure Name: LMA Insertion Date/Time: 04/30/2018 7:41 AM Performed by: Alvera NovelPike,  H, CRNA Pre-anesthesia Checklist: Patient identified, Emergency Drugs available, Suction available and Patient being monitored Patient Re-evaluated:Patient Re-evaluated prior to induction Oxygen Delivery Method: Circle System Utilized Preoxygenation: Pre-oxygenation with 100% oxygen Induction Type: IV induction Ventilation: Mask ventilation without difficulty LMA: LMA inserted LMA Size: 4.0 Number of attempts: 1 Placement Confirmation: positive ETCO2 Tube secured with: Tape Dental Injury: Teeth and Oropharynx as per pre-operative assessment
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
## 2018-04-30 NOTE — Discharge Instructions (Addendum)
Use ointment in left eye twice a day for one week and in right eye twice a day for 1-2 days.

## 2018-05-03 ENCOUNTER — Encounter (HOSPITAL_COMMUNITY): Payer: Self-pay | Admitting: Ophthalmology

## 2018-05-03 DIAGNOSIS — Z3042 Encounter for surveillance of injectable contraceptive: Secondary | ICD-10-CM | POA: Diagnosis not present

## 2018-05-03 DIAGNOSIS — N760 Acute vaginitis: Secondary | ICD-10-CM | POA: Diagnosis not present

## 2018-05-03 DIAGNOSIS — Z113 Encounter for screening for infections with a predominantly sexual mode of transmission: Secondary | ICD-10-CM | POA: Diagnosis not present

## 2018-05-05 NOTE — Op Note (Signed)
Zisel Lovelace 05/05/2018 Diagnosis: embedded foreign body left eye  Procedure: Left eye globe exploration and repair of globe laceration left eye and removal of embedded foreign body left eye Operative Eye:  left eye  Surgeon: Harrold Donath Estimated Blood Loss: minimal Specimens for Pathology:  None Complications: none   The  patient was prepped and draped in the usual fashion for ocular surgery on the  left eye .  A lid speculum was placed.  A localized peritomy was performed.  The site of laceration was explored and a glass foreign body was removed.  A partial thickness scleral laceration was noted. The laceration and overlying tenon's was closed with 7-0 vicryl while the overlying conjunctiva was closed with 5-0 chromic.  Retinal exam did not reveal any penetration in the globe  The speculum and drapes were removed and the eye was patched with Polymixin/Bacitracin ophthalmic ointment. An eye shield was placed and the patient was transferred alert and conversant with stable vital signs to the post operative recovery area.  The patient tolerated the procedure well and no complications were noted.  Harrold Donath MD

## 2018-10-10 ENCOUNTER — Ambulatory Visit (HOSPITAL_COMMUNITY)
Admission: EM | Admit: 2018-10-10 | Discharge: 2018-10-10 | Disposition: A | Discharge status: Home / Self Care | Payer: BLUE CROSS/BLUE SHIELD | Attending: Physician Assistant | Admitting: Physician Assistant

## 2018-10-10 ENCOUNTER — Other Ambulatory Visit: Payer: Self-pay

## 2018-10-10 ENCOUNTER — Encounter (HOSPITAL_COMMUNITY): Payer: Self-pay | Admitting: Emergency Medicine

## 2018-10-10 DIAGNOSIS — Z20822 Contact with and (suspected) exposure to covid-19: Secondary | ICD-10-CM

## 2018-10-10 DIAGNOSIS — J029 Acute pharyngitis, unspecified: Secondary | ICD-10-CM

## 2018-10-10 DIAGNOSIS — R509 Fever, unspecified: Secondary | ICD-10-CM

## 2018-10-10 LAB — POCT RAPID STREP A: Streptococcus, Group A Screen (Direct): NEGATIVE

## 2018-10-10 MED ORDER — AMOXICILLIN 500 MG PO CAPS: 500.0000 mg | ORAL_CAPSULE | Freq: Two times a day (BID) | ORAL | 0 refills | Status: AC

## 2018-10-10 NOTE — ED Triage Notes (Signed)
Sore throat, fever of 103 temp, aching

## 2018-10-10 NOTE — ED Provider Notes (Signed)
[facility]    CSN: [id number] Arrival date & time: 10/10/18  1653     History   Chief Complaint Chief Complaint  Patient presents with  . Sore Throat    HPI Mallory Nelson is a 28 y.o. female.   28 year old female comes in for 2-day history of sore throat, fever, aching.  Denies rhinorrhea, nasal congestion, cough.  States fever of 103 prior to arrival, took antipyretics.  Denies shortness of breath, wheezing.  Painful swallowing without trouble breathing, tripoding, drooling, trismus.  No obvious sick contact, COVID contact.     History reviewed. No pertinent past medical history.  There are no active problems to display for this patient.   Past Surgical History:  Procedure Laterality Date  . CYSTECTOMY Left    Cyst Removal Left Breast  . EYE EXAMINATION UNDER ANESTHESIA Bilateral 04/30/2018   Procedure: GLOBE EXPLORATION , REMOVAL OF FOREIGN BODY (GLASS);  Surgeon: Carmela RimaPatel, Narendra, MD;  [facility];  Service: Ophthalmology;  Laterality: Bilateral;    OB History   No obstetric history on file.      Home Medications    Prior to Admission medications   Medication Sig Start Date End Date Taking? Authorizing Provider  amoxicillin (AMOXIL) 500 MG capsule Take 1 capsule (500 mg total) by mouth 2 (two) times daily for 10 days. 10/10/18 10/20/18  Belinda FisherYu,  V, PA-C  ibuprofen (ADVIL,MOTRIN) 200 MG tablet Take 600 mg by mouth every 6 (six) hours as needed for mild pain.    [provider]    Family History History reviewed. No pertinent family history.  Social History Social History   Tobacco Use  . Smoking status: Current Every Day Smoker    Packs/day: 1.00  . Smokeless tobacco: Former Engineer, waterUser  Substance Use Topics  . Alcohol use: Yes  . Drug use: Yes    Types: Marijuana     Allergies   Latex   Review of Systems Review of Systems  Reason unable to perform ROS: See HPI as above.     Physical Exam Triage Vital Signs ED Triage Vitals   Enc Vitals Group     BP 10/10/18 1814 98/63     Pulse Rate 10/10/18 1814 88     Resp 10/10/18 1814 16     Temp 10/10/18 1814 98.8 F (37.1 C)     Temp Source 10/10/18 1814 Oral     SpO2 10/10/18 1814 98 %     Weight --      Height --      Head Circumference --      Peak Flow --      Pain Score 10/10/18 1810 8     Pain Loc --      Pain Edu? --      Excl. in GC? --    No data found.  Updated Vital Signs BP 98/63 (BP Location: Right Arm)   Pulse 88   Temp 98.8 F (37.1 C) (Oral)   Resp 16   SpO2 98%   Physical Exam Constitutional:      General: She is not in acute distress.    Appearance: Normal appearance. She is not ill-appearing, toxic-appearing or diaphoretic.  HENT:     Head: Normocephalic and atraumatic.     Mouth/Throat:     Mouth: Mucous membranes are moist.     Pharynx: Oropharynx is clear. Uvula midline. Posterior oropharyngeal erythema present.     Tonsils: No tonsillar exudate. 2+ on the right. 2+ on  the left.  Neck:     Musculoskeletal: Normal range of motion and neck supple.  Cardiovascular:     Rate and Rhythm: Normal rate and regular rhythm.     Heart sounds: Normal heart sounds. No murmur. No friction rub. No gallop.   Pulmonary:     Effort: Pulmonary effort is normal. No accessory muscle usage, prolonged expiration, respiratory distress or retractions.     Comments: Lungs clear to auscultation without adventitious lung sounds. Neurological:     General: No focal deficit present.     Mental Status: She is alert and oriented to person, place, and time.      UC Treatments / Results  Labs (all labs ordered are listed, but only abnormal results are displayed) Labs Reviewed  CULTURE, GROUP A STREP [facility])  POCT RAPID STREP A    EKG   Radiology No results found.  Procedures Procedures (including critical care time)  Medications Ordered in UC Medications - No data to display  Initial Impression / Assessment and Plan / UC Course  I have  reviewed the triage vital signs and the nursing notes.  Pertinent labs & imaging results that were available during my care of the patient were reviewed by me and considered in my medical decision making (see chart for details).    Rapid strep negative.  Given history and exam will cover for tonsillitis with amoxicillin.  However, discussed possible COVID causing symptoms as well.  Cover testing ordered.  Patient to stay quarantined until testing results.  Return precautions given.  Final Clinical Impressions(s) / UC Diagnoses   Final diagnoses:  Sore throat  Fever, unspecified    ED Prescriptions    Medication Sig Dispense Auth. Provider   amoxicillin (AMOXIL) 500 MG capsule Take 1 capsule (500 mg total) by mouth 2 (two) times daily for 10 days. 20 capsule Tobin Chad, [place] 10/10/18 1847

## 2018-10-10 NOTE — Discharge Instructions (Signed)
Rapid strep is negative.  Given current history, will cover for tonsillitis with amoxicillin.  As discussed, given fever, if symptoms does not improve significantly with amoxicillin in 24 hours, will require COVID swabbing.  Orders sent.  Continue Tylenol to help with fever and chills. Keep hydrated, urine should be clear to pale yellow in color.  Stay quarantined until testing results.  If experiencing shortness of breath, go to the ED for further evaluation needed. Monitor for any worsening of symptoms, trouble breathing, trouble swallowing, swelling of the throat, leaning forward to breath, drooling, follow up here or at the emergency department for reevaluation.

## 2018-10-11 ENCOUNTER — Telehealth: Payer: Self-pay | Admitting: *Deleted

## 2018-10-11 NOTE — Telephone Encounter (Signed)
-----   Message from Ok Edwards, [place] sent at 10/10/2018  6:35 PM EDT ----- Regarding: Need covid testing Sore throat, fever with T-max of 103.  No cough, rhinorrhea, nasal congestion, loss of taste or smell.  Rapid strep negative.  No obvious COVID contact.

## 2018-10-11 NOTE — Telephone Encounter (Signed)
Attempted to call patient to scheduled COVID testing- home # busy, Cell # left message to call back

## 2018-10-13 ENCOUNTER — Other Ambulatory Visit: Payer: Self-pay

## 2018-10-13 ENCOUNTER — Emergency Department (HOSPITAL_COMMUNITY)
Admission: EM | Admit: 2018-10-13 | Discharge: 2018-10-13 | Disposition: A | Discharge status: Home / Self Care | Payer: Self-pay | Attending: Emergency Medicine | Admitting: Emergency Medicine

## 2018-10-13 DIAGNOSIS — J029 Acute pharyngitis, unspecified: Secondary | ICD-10-CM | POA: Insufficient documentation

## 2018-10-13 DIAGNOSIS — F17200 Nicotine dependence, unspecified, uncomplicated: Secondary | ICD-10-CM | POA: Insufficient documentation

## 2018-10-13 LAB — CULTURE, GROUP A STREP (THRC)

## 2018-10-13 MED ORDER — OXYCODONE-ACETAMINOPHEN 5-325 MG PO TABS: 1.0000 | ORAL_TABLET | Freq: Four times a day (QID) | ORAL | 0 refills | Status: DC | PRN

## 2018-10-13 MED ORDER — DEXAMETHASONE 4 MG PO TABS
10.0000 mg | ORAL_TABLET | Freq: Once | ORAL | Status: AC
  Administered 2018-10-13: 12:00:00 10 mg via ORAL
  Filled 2018-10-13: qty 3

## 2018-10-13 MED ORDER — OXYCODONE-ACETAMINOPHEN 5-325 MG PO TABS
1.0000 | ORAL_TABLET | Freq: Once | ORAL | Status: AC
  Administered 2018-10-13: 1 via ORAL
  Filled 2018-10-13: qty 1

## 2018-10-13 NOTE — ED Notes (Signed)
Patient verbalizes understanding of discharge instructions. Opportunity for questioning and answers were provided. Armband removed by staff, pt discharged from ED.  

## 2018-10-13 NOTE — Discharge Instructions (Addendum)
Please read attached information. If you experience any new or worsening signs or symptoms please return to the emergency room for evaluation. Please follow-up with your primary care provider or specialist as discussed. Please use medication prescribed only as directed and discontinue taking if you have any concerning signs or symptoms.   °

## 2018-10-13 NOTE — ED Triage Notes (Signed)
Pt here for evaluation of sore throat, fever, lower back pain, tingling in legs at night, dizziness when standing for long periods of time since Friday night. Had negative strep test on Sunday. Was given rx for tonsillitis on Monday.

## 2018-10-13 NOTE — ED Provider Notes (Signed)
[facility] EMERGENCY DEPARTMENT Provider Note   CSN: [id number] Arrival date & time: 10/13/18  1035    History   Chief Complaint Chief Complaint  Patient presents with  . Sore Throat    HPI Mallory Nelson is a 28 y.o. female.     HPI    28 year old female presents today with complaints of sore throat.  Patient notes symptoms started approximately 5 days ago with sore throat and fever.  She notes a T-max of 103.  She notes body aches and fatigue.  She reports she was seen at urgent care she had negative strep and was started on antibiotics.  She notes she has been taking antibiotics but have not improved her symptoms.  She notes that she has fevers at night but none during the day, last antipyretic was ibuprofen approximately 10 hours ago.  She notes she is able to swallow but is severely painful.  She notes pain in her neck.  She denies any cough.  She was tested for COVID, awaiting results.  No rashes or lesions noted.  She notes a significant past medical history of pharyngitis.   No past medical history on file.  There are no active problems to display for this patient.   Past Surgical History:  Procedure Laterality Date  . CYSTECTOMY Left    Cyst Removal Left Breast  . EYE EXAMINATION UNDER ANESTHESIA Bilateral 04/30/2018   Procedure: GLOBE EXPLORATION , REMOVAL OF FOREIGN BODY (GLASS);  Surgeon: Jalene Mullet, MD;  Location: [facility];  Service: Ophthalmology;  Laterality: Bilateral;     OB History   No obstetric history on file.      Home Medications    Prior to Admission medications   Medication Sig Start Date End Date Taking? Authorizing Provider  amoxicillin (AMOXIL) 500 MG capsule Take 1 capsule (500 mg total) by mouth 2 (two) times daily for 10 days. 10/10/18 10/20/18  Ok Edwards, PA-C  ibuprofen (ADVIL,MOTRIN) 200 MG tablet Take 600 mg by mouth every 6 (six) hours as needed for mild pain.    [provider]  oxyCODONE-acetaminophen  (PERCOCET/ROXICET) 5-325 MG tablet Take 1 tablet by mouth every 6 (six) hours as needed. 10/13/18   Okey Regal, PA-C    Family History No family history on file.  Social History Social History   Tobacco Use  . Smoking status: Current Every Day Smoker    Packs/day: 1.00  . Smokeless tobacco: Former Network engineer Use Topics  . Alcohol use: Yes  . Drug use: Yes    Types: Marijuana     Allergies   Latex   Review of Systems Review of Systems  All other systems reviewed and are negative.  Physical Exam Updated Vital Signs BP 105/77 (BP Location: Right Arm)   Pulse 97   Temp 98.8 F (37.1 C) (Oral)   Resp 16   Ht 5\' 6"  (1.676 m)   Wt 59.9 kg   SpO2 99%   BMI 21.31 kg/m    Physical Exam Vitals signs and nursing note reviewed.  Constitutional:      Appearance: She is well-developed.  HENT:     Head: Normocephalic and atraumatic.     Comments: Erythematous oropharynx with minimal tonsillar swelling, small ulceration on the right anterior tonsillar pillar, no ulcerations or exudate noted throughout, uvula is midline rises with phonation, no pooling secretions, voice is normal neck is supple with full active range of motion with bilateral tender anterior cervical lymphadenopathy Eyes:  General: No scleral icterus.       Right eye: No discharge.        Left eye: No discharge.     Conjunctiva/sclera: Conjunctivae normal.     Pupils: Pupils are equal, round, and reactive to light.  Neck:     Musculoskeletal: Normal range of motion.     Vascular: No JVD.     Trachea: No tracheal deviation.  Pulmonary:     Effort: Pulmonary effort is normal.     Breath sounds: No stridor.  Skin:    Comments: No rashes  Neurological:     Mental Status: She is alert and oriented to person, place, and time.     Coordination: Coordination normal.  Psychiatric:        Behavior: Behavior normal.        Thought Content: Thought content normal.        Judgment: Judgment normal.       ED Treatments / Results  Labs (all labs ordered are listed, but only abnormal results are displayed) Labs Reviewed - No data to display  EKG None  Radiology No results found.  Procedures Procedures (including critical care time)  Medications Ordered in ED Medications  dexamethasone (DECADRON) tablet 10 mg (has no administration in time range)  oxyCODONE-acetaminophen (PERCOCET/ROXICET) 5-325 MG per tablet 1 tablet (has no administration in time range)     Initial Impression / Assessment and Plan / ED Course  I have reviewed the triage vital signs and the nursing notes.  Pertinent labs & imaging results that were available during my care of the patient were reviewed by me and considered in my medical decision making (see chart for details).         Assessment/Plan: 28 year old female presents today with sore throat.  Patient reports fever, no fever today no recent antipyretics.  She does have significantly erythematous throat.  She does have a small ulceration, high suspicion for viral etiology.  She has a negative strep test.  COVID testing pending.  I do believe patient would benefit from pain medication steroids and repeat evaluation if symptoms are not improving in 2 days.  She is given strict return precautions, she verbalized understanding and agreement to today's plan had no further questions or concerns at the time of discharge.      Final Clinical Impressions(s) / ED Diagnoses   Final diagnoses:  Pharyngitis, unspecified etiology    ED Discharge Orders         Ordered    oxyCODONE-acetaminophen (PERCOCET/ROXICET) 5-325 MG tablet  Every 6 hours PRN     10/13/18 1201           Eyvonne MechanicHedges, , PA-C 10/13/18 1202    Long, Arlyss RepressJoshua G, MD 10/14/18 [phone]

## 2019-06-02 DIAGNOSIS — Z3042 Encounter for surveillance of injectable contraceptive: Secondary | ICD-10-CM | POA: Diagnosis not present

## 2019-06-02 DIAGNOSIS — Z3009 Encounter for other general counseling and advice on contraception: Secondary | ICD-10-CM | POA: Diagnosis not present

## 2019-08-05 ENCOUNTER — Other Ambulatory Visit: Payer: Self-pay | Admitting: Obstetrics and Gynecology

## 2019-08-05 DIAGNOSIS — R102 Pelvic and perineal pain: Secondary | ICD-10-CM | POA: Diagnosis not present

## 2019-08-05 DIAGNOSIS — Z3042 Encounter for surveillance of injectable contraceptive: Secondary | ICD-10-CM | POA: Diagnosis not present

## 2019-08-05 DIAGNOSIS — Z01419 Encounter for gynecological examination (general) (routine) without abnormal findings: Secondary | ICD-10-CM | POA: Diagnosis not present

## 2019-08-05 DIAGNOSIS — M7918 Myalgia, other site: Secondary | ICD-10-CM | POA: Diagnosis not present

## 2019-08-05 DIAGNOSIS — Z113 Encounter for screening for infections with a predominantly sexual mode of transmission: Secondary | ICD-10-CM | POA: Diagnosis not present

## 2019-08-15 ENCOUNTER — Ambulatory Visit
Admission: RE | Admit: 2019-08-15 | Discharge: 2019-08-15 | Disposition: A | Discharge status: Home / Self Care | Payer: Self-pay | Source: Ambulatory Visit | Attending: Obstetrics and Gynecology | Admitting: Obstetrics and Gynecology

## 2019-08-15 DIAGNOSIS — R102 Pelvic and perineal pain: Secondary | ICD-10-CM

## 2019-08-18 DIAGNOSIS — A549 Gonococcal infection, unspecified: Secondary | ICD-10-CM | POA: Diagnosis not present

## 2019-09-01 DIAGNOSIS — Z3202 Encounter for pregnancy test, result negative: Secondary | ICD-10-CM | POA: Diagnosis not present

## 2019-09-01 DIAGNOSIS — Z3042 Encounter for surveillance of injectable contraceptive: Secondary | ICD-10-CM | POA: Diagnosis not present

## 2019-09-06 DIAGNOSIS — R102 Pelvic and perineal pain: Secondary | ICD-10-CM | POA: Diagnosis not present

## 2019-09-06 DIAGNOSIS — Z202 Contact with and (suspected) exposure to infections with a predominantly sexual mode of transmission: Secondary | ICD-10-CM | POA: Diagnosis not present

## 2019-12-23 ENCOUNTER — Other Ambulatory Visit: Payer: Self-pay

## 2019-12-23 ENCOUNTER — Encounter: Payer: Self-pay | Admitting: Family Medicine

## 2019-12-23 ENCOUNTER — Ambulatory Visit (INDEPENDENT_AMBULATORY_CARE_PROVIDER_SITE_OTHER): Payer: BLUE CROSS/BLUE SHIELD

## 2019-12-23 ENCOUNTER — Ambulatory Visit (INDEPENDENT_AMBULATORY_CARE_PROVIDER_SITE_OTHER): Payer: BLUE CROSS/BLUE SHIELD | Admitting: Family Medicine

## 2019-12-23 ENCOUNTER — Ambulatory Visit: Payer: Self-pay

## 2019-12-23 VITALS — BP 90/64 | Ht 66.0 in | Wt 142.2 lb

## 2019-12-23 DIAGNOSIS — M25562 Pain in left knee: Secondary | ICD-10-CM | POA: Diagnosis not present

## 2019-12-23 DIAGNOSIS — G8929 Other chronic pain: Secondary | ICD-10-CM

## 2019-12-23 DIAGNOSIS — R600 Localized edema: Secondary | ICD-10-CM | POA: Diagnosis not present

## 2019-12-23 NOTE — Progress Notes (Signed)
Subjective:    CC: L knee pain  I, Molly Weber, LAT, ATC, am serving as scribe for Dr. Clementeen Graham.  HPI: Pt is a 29 y/o female presenting w/ c/o L chronic L knee pain that worsened since May 2021.  She locates her pain to her L lateral knee, just lateral to the patella .  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  She does note some clicking in the lateral knee.  Pain is located mostly at the lateral knee but does occur in the anterior knee.  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  Radiating pain: No L knee swelling: yes L knee mechanical symptoms: yes Aggravating factors: climbing ladders; prolonged standing and walking Treatments tried: knee brace; TENs; Aleve prn  Pertinent review of Systems: No fevers or chills  Relevant historical information: Healthy otherwise   Objective:    Vitals:   12/23/19 1106  BP: 90/64   General: Well Developed, well nourished, and in no acute distress.   MSK: Left knee largely normal-appearing with no significant swelling or deformity. Normal motion without crepitation. Tender palpation lateral joint line.  Mildly tender palpation at patella tendon.  Palpable squeak overlying patella and patellar tendon. Stable ligamentous exam. Positive lateral McMurray's test negative medial. Intact strength flexion and extension.  Lab and Radiology Results  X-ray images left knee obtained today personally and independently reviewed Normal-appearing x-ray with no significant DJD or acute injuries visible. Await formal radiology review  Procedure: Real-time Ultrasound Guided Injection of left knee lateral superior patellar space Device: Philips Affiniti 50G Images permanently stored and available for review in PACS Ultrasound examination of knee prior to injection significant for mild prepatellar bursitis and infrapatellar tendon bursitis mildly as well as slightly  degenerative extruded appearing lateral meniscus. Verbal informed consent obtained.  Discussed risks and benefits of procedure. Warned about infection bleeding damage to structures skin hypopigmentation and fat atrophy among others. Patient expresses understanding and agreement Time-out conducted.   Noted no overlying erythema, induration, or other signs of local infection.   Skin prepped in a sterile fashion.   Local anesthesia: Topical Ethyl chloride.   With sterile technique and under real time ultrasound guidance:  40 mg of Kenalog and 2 mL of Marcaine injected into the knee joint. Fluid seen entering the joint capsule.   Completed without difficulty   Pain immediately resolved suggesting accurate placement of the medication.   Advised to call if fevers/chills, erythema, induration, drainage, or persistent bleeding.   Images permanently stored and available for review in the ultrasound unit.  Impression: Technically successful ultrasound guided injection.     Impression and Recommendations:    Assessment and Plan: 29 y.o. female with left knee pain worsening over period of a few months.  Pain predominantly located at lateral knee and is somewhat concerning for a small lateral meniscus tear.  She also has changes consistent with patellar tendinitis and prepatellar bursitis.  Plan to treat with Voltaren gel steroid injection as above and home exercise program.  Consider formal physical therapy as well.  However neck step at this point if not better would probably be MRI given her mechanical symptoms.  Work note provided today.Marland Kitchen  PDMP not reviewed this encounter. Orders Placed This Encounter  Procedures  . [place] LIMITED JOINT SPACE STRUCTURES LOW LEFT(NO LINKED CHARGES)    Order Specific Question:   Reason for Exam (SYMPTOM  OR  DIAGNOSIS REQUIRED)    Answer:   L knee pain    Order Specific Question:   Preferred imaging location?    Answer:   [facility]  . DG Knee  AP/LAT W/Sun[place] Left    Standing Status:   Future    Number of Occurrences:   1    Standing Expiration Date:   12/22/2020    Order Specific Question:   Reason for Exam (SYMPTOM  OR DIAGNOSIS REQUIRED)    Answer:   eval left knee pain    Order Specific Question:   Is patient pregnant?    Answer:   No    Order Specific Question:   Preferred imaging location?    Answer:   [facility]    Order Specific Question:   Radiology Contrast Protocol - do NOT remove file path    Answer:   [email or web address]   No orders of the defined types were placed in this encounter.   Discussed warning signs or symptoms. Please see discharge instructions. Patient expresses understanding.   The above documentation has been reviewed and is accurate and complete Clementeen Graham, M.D.

## 2019-12-23 NOTE — Patient Instructions (Signed)
Thank you for coming in today.  Please get an Xray today before you leave  Please use voltaren gel up to 4x daily for pain as needed.   If not improved let me know. Next step is MRI.

## 2019-12-26 NOTE — Progress Notes (Signed)
X-ray left knee shows some fluid in the knee joint.  Not a lot of arthritis present.

## 2020-04-03 DIAGNOSIS — Z30013 Encounter for initial prescription of injectable contraceptive: Secondary | ICD-10-CM | POA: Diagnosis not present

## 2020-04-03 DIAGNOSIS — Z113 Encounter for screening for infections with a predominantly sexual mode of transmission: Secondary | ICD-10-CM | POA: Diagnosis not present

## 2020-04-03 DIAGNOSIS — Z3200 Encounter for pregnancy test, result unknown: Secondary | ICD-10-CM | POA: Diagnosis not present

## 2020-04-12 ENCOUNTER — Ambulatory Visit
Admission: EM | Admit: 2020-04-12 | Discharge: 2020-04-12 | Disposition: A | Discharge status: Home / Self Care | Payer: BLUE CROSS/BLUE SHIELD | Attending: Emergency Medicine | Admitting: Emergency Medicine

## 2020-04-12 ENCOUNTER — Other Ambulatory Visit: Payer: Self-pay

## 2020-04-12 DIAGNOSIS — M7989 Other specified soft tissue disorders: Secondary | ICD-10-CM | POA: Diagnosis not present

## 2020-04-12 DIAGNOSIS — R229 Localized swelling, mass and lump, unspecified: Secondary | ICD-10-CM | POA: Diagnosis not present

## 2020-04-12 DIAGNOSIS — R238 Other skin changes: Secondary | ICD-10-CM

## 2020-04-12 MED ORDER — DOXYCYCLINE HYCLATE 100 MG PO CAPS: 100.0000 mg | ORAL_CAPSULE | Freq: Two times a day (BID) | ORAL | 0 refills | Status: AC

## 2020-04-12 MED ORDER — TRIAMCINOLONE ACETONIDE 0.1 % EX CREA: 1.0000 "application " | TOPICAL_CREAM | Freq: Two times a day (BID) | CUTANEOUS | 0 refills | Status: DC

## 2020-04-12 NOTE — Discharge Instructions (Signed)
Begin doxycycline twice daily for 1 week Triamcinolone twice daily to area Cool compresses Tylenol and ibuprofen for any pain and swelling

## 2020-04-12 NOTE — ED Provider Notes (Signed)
[facility]    CSN: [id number] Arrival date & time: 04/12/20  1646      History   Chief Complaint Chief Complaint  Patient presents with  . Wound Check    Occurred yesterday    HPI Mallory Nelson is a 30 y.o. female presenting today for evaluation of right arm swelling and itching.  Reports yesterday had 2 small areas of itchy aching and mild swelling to right upper arm.  Over the past 24 hours areas have become more swollen and firm, has some mild discomfort, but reports a lot of itching associated with it.  Denies any fevers nausea vomiting dizziness or lightheadedness.  Denies any difficulty moving right arm at elbow or shoulder.  HPI  History reviewed. No pertinent past medical history.  There are no problems to display for this patient.   Past Surgical History:  Procedure Laterality Date  . CYSTECTOMY Left    Cyst Removal Left Breast  . EYE EXAMINATION UNDER ANESTHESIA Bilateral 04/30/2018   Procedure: GLOBE EXPLORATION , REMOVAL OF FOREIGN BODY (GLASS);  Surgeon: Carmela Rima, MD;  [facility]: [facility];  Service: Ophthalmology;  Laterality: Bilateral;    OB History   No obstetric history on file.      Home Medications    Prior to Admission medications   Medication Sig Start Date End Date Taking? Authorizing Provider  doxycycline (VIBRAMYCIN) 100 MG capsule Take 1 capsule (100 mg total) by mouth 2 (two) times daily for 7 days. 04/12/20 04/19/20 Yes ,  C, PA-C  ibuprofen (ADVIL,MOTRIN) 200 MG tablet Take 600 mg by mouth every 6 (six) hours as needed for mild pain.   Yes [provider]  triamcinolone (KENALOG) 0.1 % Apply 1 application topically 2 (two) times daily. 04/12/20  Yes , Junius Creamer, PA-C    Family History History reviewed. No pertinent family history.  Social History Social History   Tobacco Use  . Smoking status: Current Every Day Smoker    Packs/day: 1.00  . Smokeless tobacco: Former Clinical biochemist  .  Vaping Use: Former  Substance Use Topics  . Alcohol use: Yes  . Drug use: Yes    Types: Marijuana     Allergies   Latex   Review of Systems Review of Systems  Constitutional: Negative for fatigue and fever.  HENT: Negative for mouth sores.   Eyes: Negative for visual disturbance.  Respiratory: Negative for shortness of breath.   Cardiovascular: Negative for chest pain.  Gastrointestinal: Negative for abdominal pain, nausea and vomiting.  Genitourinary: Negative for genital sores.  Musculoskeletal: Negative for arthralgias and joint swelling.  Skin: Positive for color change and rash. Negative for wound.  Neurological: Negative for dizziness, weakness, light-headedness and headaches.     Physical Exam Triage Vital Signs ED Triage Vitals [04/12/20 1709]  Enc Vitals Group     BP 125/88     Pulse Rate 77     Resp 20     Temp 98 F (36.7 C)     Temp Source Oral     SpO2 98 %     Weight      Height      Head Circumference      Peak Flow      Pain Score      Pain Loc      Pain Edu?      Excl. in GC?    No data found.  Updated Vital Signs BP 125/88 (BP Location: Left Arm)  Pulse 77   Temp 98 F (36.7 C) (Oral)   Resp 20   SpO2 98%   Visual Acuity Right Eye Distance:   Left Eye Distance:   Bilateral Distance:    Right Eye Near:   Left Eye Near:    Bilateral Near:     Physical Exam Vitals and nursing note reviewed.  Constitutional:      Appearance: She is well-developed and well-nourished.     Comments: No acute distress  HENT:     Head: Normocephalic and atraumatic.     Nose: Nose normal.  Eyes:     Conjunctiva/sclera: Conjunctivae normal.  Cardiovascular:     Rate and Rhythm: Normal rate.  Pulmonary:     Effort: Pulmonary effort is normal. No respiratory distress.  Abdominal:     General: There is no distension.  Musculoskeletal:        General: Normal range of motion.     Cervical back: Neck supple.  Skin:    General: Skin is warm and  dry.     Comments: Right upper arm with 2 areas of erythema swelling warmth and some induration, lesion more distal approximately 2 times the size of superior lesion  Neurological:     Mental Status: She is alert and oriented to person, place, and time.  Psychiatric:        Mood and Affect: Mood and affect normal.      UC Treatments / Results  Labs (all labs ordered are listed, but only abnormal results are displayed) Labs Reviewed - No data to display  EKG   Radiology No results found.  Procedures Procedures (including critical care time)  Medications Ordered in UC Medications - No data to display  Initial Impression / Assessment and Plan / UC Course  I have reviewed the triage vital signs and the nursing notes.  Pertinent labs & imaging results that were available during my care of the patient were reviewed by me and considered in my medical decision making (see chart for details).     Areas do look concerning for possible bites, unclear etiology, likely more localized inflammation, but given associated induration and warmth we will go ahead and cover for infection with doxycycline, triamcinolone topically, continue antihistamines.  Cool compresses.  Continue to monitor.  Discussed strict return precautions. Patient verbalized understanding and is agreeable with plan.  Final Clinical Impressions(s) / UC Diagnoses   Final diagnoses:  Redness and swelling of upper arm  Localized soft tissue swelling     Discharge Instructions     Begin doxycycline twice daily for 1 week Triamcinolone twice daily to area Cool compresses Tylenol and ibuprofen for any pain and swelling    ED Prescriptions    Medication Sig Dispense Auth. Provider   doxycycline (VIBRAMYCIN) 100 MG capsule Take 1 capsule (100 mg total) by mouth 2 (two) times daily for 7 days. 14 capsule ,  C, PA-C   triamcinolone (KENALOG) 0.1 % Apply 1 application topically 2 (two) times daily. 45 g  , [place] C, PA-C     PDMP not reviewed this encounter.   Lew Dawes, [place] 04/12/20 1827

## 2020-04-12 NOTE — ED Triage Notes (Signed)
Patient states she noticed the 2 welts on her right upper arm that have since increased in size and are hot to the touch. Pt complains of some pain but a lot of itching. Pt is aox4 and ambulatory.

## 2020-07-04 DIAGNOSIS — Z3009 Encounter for other general counseling and advice on contraception: Secondary | ICD-10-CM | POA: Diagnosis not present

## 2020-07-04 DIAGNOSIS — Z3042 Encounter for surveillance of injectable contraceptive: Secondary | ICD-10-CM | POA: Diagnosis not present

## 2020-09-28 DIAGNOSIS — Z113 Encounter for screening for infections with a predominantly sexual mode of transmission: Secondary | ICD-10-CM | POA: Diagnosis not present

## 2020-09-28 DIAGNOSIS — N760 Acute vaginitis: Secondary | ICD-10-CM | POA: Diagnosis not present

## 2020-10-09 DIAGNOSIS — Z3042 Encounter for surveillance of injectable contraceptive: Secondary | ICD-10-CM | POA: Diagnosis not present

## 2020-10-09 DIAGNOSIS — Z3009 Encounter for other general counseling and advice on contraception: Secondary | ICD-10-CM | POA: Diagnosis not present

## 2021-06-24 DIAGNOSIS — M9903 Segmental and somatic dysfunction of lumbar region: Secondary | ICD-10-CM | POA: Diagnosis not present

## 2021-06-24 DIAGNOSIS — M545 Low back pain, unspecified: Secondary | ICD-10-CM | POA: Diagnosis not present

## 2021-06-24 DIAGNOSIS — M62830 Muscle spasm of back: Secondary | ICD-10-CM | POA: Diagnosis not present

## 2021-06-24 DIAGNOSIS — M9901 Segmental and somatic dysfunction of cervical region: Secondary | ICD-10-CM | POA: Diagnosis not present

## 2021-06-26 DIAGNOSIS — M545 Low back pain, unspecified: Secondary | ICD-10-CM | POA: Diagnosis not present

## 2021-06-26 DIAGNOSIS — M62830 Muscle spasm of back: Secondary | ICD-10-CM | POA: Diagnosis not present

## 2021-06-26 DIAGNOSIS — M9901 Segmental and somatic dysfunction of cervical region: Secondary | ICD-10-CM | POA: Diagnosis not present

## 2021-06-26 DIAGNOSIS — M9903 Segmental and somatic dysfunction of lumbar region: Secondary | ICD-10-CM | POA: Diagnosis not present

## 2021-07-01 DIAGNOSIS — M9903 Segmental and somatic dysfunction of lumbar region: Secondary | ICD-10-CM | POA: Diagnosis not present

## 2021-07-01 DIAGNOSIS — M62830 Muscle spasm of back: Secondary | ICD-10-CM | POA: Diagnosis not present

## 2021-07-01 DIAGNOSIS — M545 Low back pain, unspecified: Secondary | ICD-10-CM | POA: Diagnosis not present

## 2021-07-01 DIAGNOSIS — M9901 Segmental and somatic dysfunction of cervical region: Secondary | ICD-10-CM | POA: Diagnosis not present

## 2021-07-03 DIAGNOSIS — M9903 Segmental and somatic dysfunction of lumbar region: Secondary | ICD-10-CM | POA: Diagnosis not present

## 2021-07-03 DIAGNOSIS — M9901 Segmental and somatic dysfunction of cervical region: Secondary | ICD-10-CM | POA: Diagnosis not present

## 2021-07-03 DIAGNOSIS — M62830 Muscle spasm of back: Secondary | ICD-10-CM | POA: Diagnosis not present

## 2021-07-03 DIAGNOSIS — M545 Low back pain, unspecified: Secondary | ICD-10-CM | POA: Diagnosis not present

## 2021-07-04 DIAGNOSIS — M9901 Segmental and somatic dysfunction of cervical region: Secondary | ICD-10-CM | POA: Diagnosis not present

## 2021-07-04 DIAGNOSIS — M545 Low back pain, unspecified: Secondary | ICD-10-CM | POA: Diagnosis not present

## 2021-07-04 DIAGNOSIS — M62830 Muscle spasm of back: Secondary | ICD-10-CM | POA: Diagnosis not present

## 2021-07-04 DIAGNOSIS — M9903 Segmental and somatic dysfunction of lumbar region: Secondary | ICD-10-CM | POA: Diagnosis not present

## 2021-07-11 DIAGNOSIS — M545 Low back pain, unspecified: Secondary | ICD-10-CM | POA: Diagnosis not present

## 2021-07-11 DIAGNOSIS — M9901 Segmental and somatic dysfunction of cervical region: Secondary | ICD-10-CM | POA: Diagnosis not present

## 2021-07-11 DIAGNOSIS — M9903 Segmental and somatic dysfunction of lumbar region: Secondary | ICD-10-CM | POA: Diagnosis not present

## 2021-07-11 DIAGNOSIS — M62830 Muscle spasm of back: Secondary | ICD-10-CM | POA: Diagnosis not present

## 2021-07-15 DIAGNOSIS — M9903 Segmental and somatic dysfunction of lumbar region: Secondary | ICD-10-CM | POA: Diagnosis not present

## 2021-07-15 DIAGNOSIS — M545 Low back pain, unspecified: Secondary | ICD-10-CM | POA: Diagnosis not present

## 2021-07-15 DIAGNOSIS — M62838 Other muscle spasm: Secondary | ICD-10-CM | POA: Diagnosis not present

## 2021-07-15 DIAGNOSIS — M62830 Muscle spasm of back: Secondary | ICD-10-CM | POA: Diagnosis not present

## 2021-07-15 DIAGNOSIS — M9901 Segmental and somatic dysfunction of cervical region: Secondary | ICD-10-CM | POA: Diagnosis not present

## 2021-08-09 DIAGNOSIS — R1011 Right upper quadrant pain: Secondary | ICD-10-CM | POA: Diagnosis not present

## 2021-08-10 ENCOUNTER — Emergency Department (HOSPITAL_BASED_OUTPATIENT_CLINIC_OR_DEPARTMENT_OTHER): Payer: BC Managed Care – PPO

## 2021-08-10 ENCOUNTER — Encounter (HOSPITAL_BASED_OUTPATIENT_CLINIC_OR_DEPARTMENT_OTHER): Payer: Self-pay | Admitting: Emergency Medicine

## 2021-08-10 ENCOUNTER — Emergency Department (HOSPITAL_BASED_OUTPATIENT_CLINIC_OR_DEPARTMENT_OTHER)
Admission: EM | Admit: 2021-08-10 | Discharge: 2021-08-10 | Disposition: A | Discharge status: Home / Self Care | Payer: BC Managed Care – PPO | Attending: Emergency Medicine | Admitting: Emergency Medicine

## 2021-08-10 ENCOUNTER — Other Ambulatory Visit: Payer: Self-pay

## 2021-08-10 DIAGNOSIS — R0781 Pleurodynia: Secondary | ICD-10-CM | POA: Diagnosis not present

## 2021-08-10 DIAGNOSIS — R1011 Right upper quadrant pain: Secondary | ICD-10-CM | POA: Insufficient documentation

## 2021-08-10 DIAGNOSIS — Z91040 Latex allergy status: Secondary | ICD-10-CM | POA: Diagnosis not present

## 2021-08-10 DIAGNOSIS — R071 Chest pain on breathing: Secondary | ICD-10-CM

## 2021-08-10 DIAGNOSIS — R109 Unspecified abdominal pain: Secondary | ICD-10-CM | POA: Diagnosis not present

## 2021-08-10 LAB — URINALYSIS, ROUTINE W REFLEX MICROSCOPIC
Bilirubin Urine: NEGATIVE
Glucose, UA: NEGATIVE mg/dL
Hgb urine dipstick: NEGATIVE
Ketones, ur: NEGATIVE mg/dL
Leukocytes,Ua: NEGATIVE
Nitrite: NEGATIVE
Protein, ur: 30 mg/dL — AB
Specific Gravity, Urine: >1.046 — ABNORMAL HIGH (ref 1.005–1.030)
pH: 6 (ref 5.0–8.0)

## 2021-08-10 LAB — COMPREHENSIVE METABOLIC PANEL
ALT: 12 U/L (ref 0–44)
AST: 13 U/L — ABNORMAL LOW (ref 15–41)
Albumin: 4.5 g/dL (ref 3.5–5.0)
Alkaline Phosphatase: 53 U/L (ref 38–126)
Anion gap: 9 (ref 5–15)
BUN: 11 mg/dL (ref 6–20)
CO2: 30 mmol/L (ref 22–32)
Calcium: 9.1 mg/dL (ref 8.9–10.3)
Chloride: 102 mmol/L (ref 98–111)
Creatinine, Ser: 0.83 mg/dL (ref 0.44–1.00)
GFR, Estimated: >60 mL/min (ref >60)
Glucose, Bld: 80 mg/dL (ref 70–99)
Potassium: 3.4 mmol/L — ABNORMAL LOW (ref 3.5–5.1)
Sodium: 141 mmol/L (ref 135–145)
Total Bilirubin: 1 mg/dL (ref 0.3–1.2)
Total Protein: 7.7 g/dL (ref 6.5–8.1)

## 2021-08-10 LAB — CBC
HCT: 41.5 % (ref 36.0–46.0)
Hemoglobin: 13.3 g/dL (ref 12.0–15.0)
MCH: 27.9 pg (ref 26.0–34.0)
MCHC: 32 g/dL (ref 30.0–36.0)
MCV: 87 fL (ref 80.0–100.0)
Platelets: 232 10*3/uL (ref 150–400)
RBC: 4.77 MIL/uL (ref 3.87–5.11)
RDW: 14.2 % (ref 11.5–15.5)
WBC: 6.9 10*3/uL (ref 4.0–10.5)
nRBC: 0 % (ref 0.0–0.2)

## 2021-08-10 LAB — WET PREP, GENITAL
Clue Cells Wet Prep HPF POC: NONE SEEN
Sperm: NONE SEEN
Trich, Wet Prep: NONE SEEN
WBC, Wet Prep HPF POC: >=10 — AB (ref <10)
Yeast Wet Prep HPF POC: NONE SEEN

## 2021-08-10 LAB — LIPASE, BLOOD: Lipase: 27 U/L (ref 11–51)

## 2021-08-10 LAB — HCG, SERUM, QUALITATIVE: Preg, Serum: NEGATIVE

## 2021-08-10 LAB — D-DIMER, QUANTITATIVE: D-Dimer, Quant: 0.32 ug/mL-FEU (ref 0.00–0.50)

## 2021-08-10 MED ORDER — TRAMADOL-ACETAMINOPHEN 37.5-325 MG PO TABS: 1.0000 | ORAL_TABLET | Freq: Four times a day (QID) | ORAL | 0 refills | Status: DC | PRN

## 2021-08-10 MED ORDER — FENTANYL CITRATE PF 50 MCG/ML IJ SOSY
50.0000 ug | PREFILLED_SYRINGE | Freq: Once | INTRAMUSCULAR | Status: AC
  Administered 2021-08-10: 50 ug via INTRAVENOUS
  Filled 2021-08-10: qty 1

## 2021-08-10 MED ORDER — PANTOPRAZOLE SODIUM 40 MG PO TBEC
40.0000 mg | DELAYED_RELEASE_TABLET | Freq: Once | ORAL | Status: AC
  Administered 2021-08-10: 40 mg via ORAL
  Filled 2021-08-10: qty 1

## 2021-08-10 MED ORDER — ALUM & MAG HYDROXIDE-SIMETH 200-200-20 MG/5ML PO SUSP
30.0000 mL | Freq: Once | ORAL | Status: AC
  Administered 2021-08-10: 30 mL via ORAL
  Filled 2021-08-10: qty 30

## 2021-08-10 MED ORDER — SODIUM CHLORIDE 0.9 % IV BOLUS
1000.0000 mL | Freq: Once | INTRAVENOUS | Status: AC
  Administered 2021-08-10: 1000 mL via INTRAVENOUS

## 2021-08-10 MED ORDER — IOHEXOL 300 MG/ML  SOLN
100.0000 mL | Freq: Once | INTRAMUSCULAR | Status: AC | PRN
  Administered 2021-08-10: 100 mL via INTRATHECAL

## 2021-08-10 MED ORDER — DOXYCYCLINE HYCLATE 100 MG PO TABS
100.0000 mg | ORAL_TABLET | Freq: Once | ORAL | Status: AC
  Administered 2021-08-10: 100 mg via ORAL
  Filled 2021-08-10: qty 1

## 2021-08-10 MED ORDER — LIDOCAINE VISCOUS HCL 2 % MT SOLN
15.0000 mL | Freq: Once | OROMUCOSAL | Status: AC
  Administered 2021-08-10: 15 mL via ORAL
  Filled 2021-08-10: qty 15

## 2021-08-10 MED ORDER — CEFTRIAXONE SODIUM 1 G IJ SOLR
1.0000 g | Freq: Once | INTRAMUSCULAR | Status: AC
Start: 2021-08-10 — End: 2021-08-10
  Administered 2021-08-10: 1 g via INTRAVENOUS
  Filled 2021-08-10: qty 10

## 2021-08-10 MED ORDER — PANTOPRAZOLE SODIUM 20 MG PO TBEC: 20.0000 mg | DELAYED_RELEASE_TABLET | Freq: Every day | ORAL | 0 refills | Status: DC

## 2021-08-10 MED ORDER — KETOROLAC TROMETHAMINE 30 MG/ML IJ SOLN
30.0000 mg | Freq: Once | INTRAMUSCULAR | Status: AC
  Administered 2021-08-10: 30 mg via INTRAVENOUS
  Filled 2021-08-10: qty 1

## 2021-08-10 MED ORDER — DOXYCYCLINE HYCLATE 100 MG PO CAPS: 100.0000 mg | ORAL_CAPSULE | Freq: Two times a day (BID) | ORAL | 0 refills | Status: DC

## 2021-08-10 NOTE — Discharge Instructions (Addendum)
1.  Stomach ulcers can cause severe pain without showing any findings on lab tests and CT scans.  The only way to see a stomach ulcer can be with an upper endoscopy.  The treatment for stomach ulcers is to start acid secretion medications.  Start taking Protonix daily as prescribed.  Follow instructions for peptic ulcer disease.  If your symptoms improved with treatment it is more likely that you have an ulcer or gastritis (inflammation of the stomach without an ulcer).  It is very important you get follow-up with a family doctor for further evaluation and monitoring of this. ?2.  Rarely, a sexually transmitted pelvic infection can cause a lot of pain in the right upper abdomen.  You did have discomfort with your pelvic exam and discharge present.  At this time you are getting antibiotics to make sure there is no infection present.  Follow-up on the remainder of your test results on MyChart. ?3.  Return to the emergency department if you develop fever, vomiting, worsening pain, worsening shortness of breath or other concerning symptoms. ?4.  For pain control you may take Ultracet.  This is a combination of a pain medication plus acetaminophen.  This is safer for the stomach if you have peptic ulcer or gastritis.  Do not take any ibuprofen, Aleve, naproxen or any medication known as an NSAID.  No Goody powders.  If you have any questions about this ask the pharmacist ?

## 2021-08-10 NOTE — ED Provider Notes (Signed)
[facility] EMERGENCY DEPT Provider Note   CSN: [id number] Arrival date & time: 08/10/21  1116     History  Chief Complaint  Patient presents with   Abdominal Pain    Mallory Nelson is a 31 y.o. female.  HPI Patient reports he has pain in her right abdomen.  It is somewhat closer to the top of the abdomen.  It started 6 days ago she reports it has progressively worsened and now is severe.  She reports now if she coughs or takes deep breath or moves a certain way she gets a lot of pain.  She indicates the area right up under her ribs on the right.  No fevers no chills no cough.  No vomiting no diarrhea.  No pain burning urgency with urination she reports she did have a menstrual cycle this week.  She did not note any abnormal vaginal discharge.  No pain or swelling of the legs.  No history of prior abdominal surgeries.    Home Medications Prior to Admission medications   Medication Sig Start Date End Date Taking? Authorizing Provider  doxycycline (VIBRAMYCIN) 100 MG capsule Take 1 capsule (100 mg total) by mouth 2 (two) times daily. One po bid x 7 days 08/10/21  Yes , Lebron Conners, MD  pantoprazole (PROTONIX) 20 MG tablet Take 1 tablet (20 mg total) by mouth daily. 08/10/21  Yes Arby Barrette, MD  traMADol-acetaminophen (ULTRACET) 37.5-325 MG tablet Take 1-2 tablets by mouth every 6 (six) hours as needed. 08/10/21  Yes Arby Barrette, MD  ibuprofen (ADVIL,MOTRIN) 200 MG tablet Take 600 mg by mouth every 6 (six) hours as needed for mild pain.    [provider]  triamcinolone (KENALOG) 0.1 % Apply 1 application topically 2 (two) times daily. 04/12/20   Wieters, Hallie C, PA-C      Allergies    Latex    Review of Systems   Review of Systems 10 systems reviewed and negative except as per HPI Physical Exam Updated Vital Signs BP 111/79 (BP Location: Left Arm)   Pulse (!) 59   Temp 98.1 F (36.7 C) (Oral)   Resp 16   Ht 5\' 6"  (1.676 m)   Wt 54.9 kg   SpO2  100%   BMI 19.53 kg/m  Physical Exam Constitutional:      Comments: Alert nontoxic uncomfortable in appearance.  No respiratory distress.  Well-nourished well-developed.  HENT:     Mouth/Throat:     Pharynx: Oropharynx is clear.  Eyes:     Extraocular Movements: Extraocular movements intact.  Cardiovascular:     Rate and Rhythm: Normal rate and regular rhythm.  Pulmonary:     Effort: Pulmonary effort is normal.     Breath sounds: Normal breath sounds.  Abdominal:     Comments: Moderate to severe pain to palpation right upper quadrant and right mid quadrant adjacent to umbilicus.  Left and lower suprapubic area nontender.  No CVA tenderness.  No rashes or soft tissue changes.  Genitourinary:    Comments: Normal external female genitalia.  Speculum exam moderate amount of yellow, pooling discharge in the vaginal vault.  Some discharge from the cervix.  Cervix is mildly friable.  Bimanual exam some tenderness to the right side of the uterus and adnexa but no palpable adnexal fullness. Musculoskeletal:        General: No swelling or tenderness. Normal range of motion.     Right lower leg: No edema.     Left lower leg: No edema.  Skin:    General: Skin is warm and dry.  Neurological:     General: No focal deficit present.     Mental Status: She is oriented to person, place, and time.     Coordination: Coordination normal.  Psychiatric:        Mood and Affect: Mood normal.    ED Results / Procedures / Treatments   Labs (all labs ordered are listed, but only abnormal results are displayed) Labs Reviewed  WET PREP, GENITAL - Abnormal; Notable for the following components:      Result Value   WBC, Wet Prep HPF POC >=10 (*)    All other components within normal limits  COMPREHENSIVE METABOLIC PANEL - Abnormal; Notable for the following components:   Potassium 3.4 (*)    AST 13 (*)    All other components within normal limits  URINALYSIS, ROUTINE W REFLEX MICROSCOPIC - Abnormal;  Notable for the following components:   Specific Gravity, Urine >1.046 (*)    Protein, ur 30 (*)    All other components within normal limits  LIPASE, BLOOD  CBC  HCG, SERUM, QUALITATIVE  D-DIMER, QUANTITATIVE  GC/CHLAMYDIA PROBE AMP ([place]) NOT AT [facility]    EKG None  Radiology CT Abdomen Pelvis W Contrast  Result Date: 08/10/2021 CLINICAL DATA:  Right lower quadrant abdominal pain EXAM: CT ABDOMEN AND PELVIS WITH CONTRAST TECHNIQUE: Multidetector CT imaging of the abdomen and pelvis was performed using the standard protocol following bolus administration of intravenous contrast. RADIATION DOSE REDUCTION: This exam was performed according to the departmental dose-optimization program which includes automated exposure control, adjustment of the mA and/or kV according to patient size and/or use of iterative reconstruction technique. CONTRAST:  OMNIPAQUE IOHEXOL 300 MG/ML  SOLN COMPARISON:  06/29/2016 FINDINGS: Lower chest: No acute abnormality. Hepatobiliary: No solid liver abnormality is seen. No gallstones, gallbladder wall thickening, or biliary dilatation. Pancreas: Unremarkable. No pancreatic ductal dilatation or surrounding inflammatory changes. Spleen: Normal in size without significant abnormality. Adrenals/Urinary Tract: Adrenal glands are unremarkable. Kidneys are normal, without renal calculi, solid lesion, or hydronephrosis. Bladder is unremarkable. Stomach/Bowel: Stomach is within normal limits. Appendix appears normal (series 5, image 35). No evidence of bowel wall thickening, distention, or inflammatory changes. Vascular/Lymphatic: No significant vascular findings are present. No enlarged abdominal or pelvic lymph nodes. Reproductive: No mass or other significant abnormality. Multiple small bilateral ovarian follicles. Other: No abdominal wall hernia or abnormality. No ascites. Musculoskeletal: No acute or significant osseous findings. IMPRESSION: No acute CT findings of the  abdomen or pelvis to explain right lower quadrant pain. Normal appendix. Electronically Signed   By: Jearld Lesch M.D.   On: 08/10/2021 16:25    Procedures Procedures    Medications Ordered in ED Medications  sodium chloride 0.9 % bolus 1,000 mL (0 mLs Intravenous Stopped 08/10/21 1723)  fentaNYL (SUBLIMAZE) injection 50 mcg (50 mcg Intravenous Given 08/10/21 1545)  iohexol (OMNIPAQUE) 300 MG/ML solution 100 mL (100 mLs Intrathecal Contrast Given 08/10/21 1606)  ketorolac (TORADOL) 30 MG/ML injection 30 mg (30 mg Intravenous Given 08/10/21 1721)  pantoprazole (PROTONIX) EC tablet 40 mg (40 mg Oral Given 08/10/21 1819)  cefTRIAXone (ROCEPHIN) 1 g in sodium chloride 0.9 % 100 mL IVPB (1 g Intravenous New Bag/Given 08/10/21 1825)  doxycycline (VIBRA-TABS) tablet 100 mg (100 mg Oral Given 08/10/21 1819)  alum & mag hydroxide-simeth (MAALOX/MYLANTA) 200-200-20 MG/5ML suspension 30 mL (30 mLs Oral Given 08/10/21 1821)    And  lidocaine (XYLOCAINE) 2 % viscous mouth  solution 15 mL (15 mLs Oral Given 08/10/21 1821)    ED Course/ Medical Decision Making/ A&P                           Medical Decision Making Amount and/or Complexity of Data Reviewed Labs: ordered. Radiology: ordered.  Risk OTC drugs. Prescription drug management.   Patient presents with abdominal pain.  She has no significant medical history.  Pain has been present for 6 days and worsened.  Pain is in the right upper and mid quadrant.  Differential diagnosis includes appendicitis\cholecystitis\colitis\pelvic ovarian etiology.  Patient is nontoxic and well we will proceed with CT scan, rehydration with normal saline and pain control with fentanyl.  CT scan does not show significant abnormality to account for patient's pain.  With amount of right upper quadrant pain and severity doubt explanation, also added D-dimer.  Dimer within normal limits.  Low suspicion for PE given amount of reproducible pain.  Do not think CT angio is  indicated.  Consideration given to Engelhard CorporationFitz-Hugh Curtis syndrome.  With exam done with some discharge and friability of the cervix.  Will opt to empirically treat with antibiotics while awaiting cultures.  Patient is aware of this plan.  Patient is nontoxic and appropriate for discharge.  Plan in place for treating for possible PID although patient is not exquisitely tender in the pelvis.  I will have patient start Protonix for possible gastritis or peptic ulcer disease.  No signs of acute bleeding ulcer.  Vital signs are stable, hemoglobin normal.  Patient given Ultracet for acute pain control.  We reviewed a follow-up plan and return precautions.        Final Clinical Impression(s) / ED Diagnoses Final diagnoses:  Right upper quadrant abdominal pain  Chest pain on breathing    Rx / DC Orders ED Discharge Orders          Ordered    doxycycline (VIBRAMYCIN) 100 MG capsule  2 times daily        08/10/21 1850    pantoprazole (PROTONIX) 20 MG tablet  Daily        08/10/21 1850    traMADol-acetaminophen (ULTRACET) 37.5-325 MG tablet  Every 6 hours PRN        08/10/21 1850              Arby BarrettePfeiffer, , MD 08/16/21 [phone]

## 2021-08-10 NOTE — ED Triage Notes (Signed)
Abd pain /right flank since Monday, yesterday went to urgent care, received toradol. Couldn't lie down on exam table due to pain.  ?

## 2021-08-10 NOTE — ED Notes (Signed)
Patient given discharge instructions. Questions were answered. Patient verbalized understanding of discharge instructions and care at home.  

## 2021-08-12 LAB — GC/CHLAMYDIA PROBE AMP (~~LOC~~) NOT AT ARMC
Chlamydia: NEGATIVE
Comment: NEGATIVE
Comment: NORMAL
Neisseria Gonorrhea: POSITIVE — AB

## 2021-08-23 ENCOUNTER — Ambulatory Visit: Payer: BC Managed Care – PPO | Admitting: Family Medicine

## 2021-08-28 ENCOUNTER — Ambulatory Visit (INDEPENDENT_AMBULATORY_CARE_PROVIDER_SITE_OTHER): Payer: BC Managed Care – PPO | Admitting: Family Medicine

## 2021-08-28 ENCOUNTER — Encounter: Payer: Self-pay | Admitting: Family Medicine

## 2021-08-28 VITALS — BP 90/58 | HR 82 | Temp 97.8°F | Ht 66.0 in | Wt 126.0 lb

## 2021-08-28 DIAGNOSIS — N939 Abnormal uterine and vaginal bleeding, unspecified: Secondary | ICD-10-CM | POA: Diagnosis not present

## 2021-08-28 DIAGNOSIS — F32A Depression, unspecified: Secondary | ICD-10-CM

## 2021-08-28 DIAGNOSIS — R1011 Right upper quadrant pain: Secondary | ICD-10-CM | POA: Diagnosis not present

## 2021-08-28 NOTE — Patient Instructions (Signed)
Obgyn Offices:   [facility] [address] [place], [place] [address] [phone]  [facility] Address: [address] [place], [place] [address] Phone: [phone]  [facility] [address] [place], [place] [address] Phone: [phone]  [facility] [address] [place], [place] [address] Phone: [phone]   You can call to schedule your appointment with a counselor. A few offices are listed below for you to call.    [facility]  [address]  (across from [facility])  [phone]      [facility] [address] [place], [place] [address] [phone]   [facility]  [address], [place], [place] [address]  Phone: [phone]   [facility] [address] [place], [place] [address]  Phone: [phone]

## 2021-08-28 NOTE — Progress Notes (Unsigned)
New Patient Office Visit  Subjective    Patient ID: Mallory Nelson, female    DOB: Aug 02, 1990  Age: 31 y.o. MRN: [id number]  CC:  Chief Complaint  Patient presents with   Establish Care    A few weeks ago had sharp pain under right rib cage, lasted for about a week. Went to Urgent Care and they said could be either appendix or gallbladder. Was given an antibiotic but was never told what it could have been and would like to discuss.    HPI Mallory Nelson presents to establish care  Reports intermittent RUQ pain.   No fever, chills, weight loss, N/V/D, urinary symptoms.   She was seen in the UC  and ED for the pain. Treated for gonorrhea infection. Pain has improved. She will see OB/GYN.  Also reports AUB  Negative CT abdomen and pelvis.   She also reports having some mild depression. Is considering seeing a therapist with her employer.   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   She is not on birth control. She had her first period in January for one month. Considering getting pregnant.      08/28/2021    8:36 AM  Depression screen PHQ 2/9  Decreased Interest 1  Down, Depressed, Hopeless 1  PHQ - 2 Score 2  Altered sleeping 2  Tired, decreased energy 1  Change in appetite 1  Feeling bad or failure about yourself  1  Trouble concentrating 3  Moving slowly or fidgety/restless 0  Suicidal thoughts 0  PHQ-9 Score 10  Difficult doing work/chores Somewhat difficult      Outpatient Encounter Medications as of 08/28/2021  Medication Sig   [DISCONTINUED] doxycycline (VIBRAMYCIN) 100 MG capsule Take 1 capsule (100 mg total) by mouth 2 (two) times daily. One po bid x 7 days   [DISCONTINUED] ibuprofen (ADVIL,MOTRIN) 200 MG tablet Take 600 mg by mouth every 6 (six) hours as needed for mild pain.   [DISCONTINUED] pantoprazole (PROTONIX) 20 MG tablet Take 1 tablet (20 mg total) by mouth daily.   [DISCONTINUED] traMADol-acetaminophen  (ULTRACET) 37.5-325 MG tablet Take 1-2 tablets by mouth every 6 (six) hours as needed.   [DISCONTINUED] triamcinolone (KENALOG) 0.1 % Apply 1 application topically 2 (two) times daily.   No facility-administered encounter medications on file as of 08/28/2021.    History reviewed. No pertinent past medical history.  Past Surgical History:  Procedure Laterality Date   CYSTECTOMY Left    Cyst Removal Left Breast   EYE EXAMINATION UNDER ANESTHESIA Bilateral 04/30/2018   Procedure: GLOBE EXPLORATION , REMOVAL OF FOREIGN BODY (GLASS);  Surgeon: Carmela Rima, MD;  [facility];  Service: Ophthalmology;  Laterality: Bilateral;    History reviewed. No pertinent family history.  Social History   Socioeconomic History   Marital status: Single    Spouse name: Not on file   Number of children: Not on file   Years of education: Not on file   Highest education level: Not on file  Occupational History   Not on file  Tobacco Use   Smoking status: Every Day    Packs/day: 1.00    Types: Cigarettes   Smokeless tobacco: Former  Building services engineer Use: Former  Substance and Sexual Activity   Alcohol use: Yes   Drug use: Yes    Types: Marijuana   Sexual activity: Yes  Other Topics Concern   Not on file  Social History  Narrative   Not on file   Social Determinants of Health   Financial Resource Strain: Not on file  Food Insecurity: Not on file  Transportation Needs: Not on file  Physical Activity: Not on file  Stress: Not on file  Social Connections: Not on file  Intimate Partner Violence: Not on file    ROS      Objective    BP (!) 90/58 (BP Location: Left Arm, Patient Position: Sitting, Cuff Size: Large)   Pulse 82   Temp 97.8 F (36.6 C) (Temporal)   Ht 5\' 6"  (1.676 m)   Wt 126 lb (57.2 kg)   LMP 08/01/2021   BMI 20.34 kg/m   Physical Exam Constitutional:      General: She is not in acute distress.    Appearance: Normal appearance. She is not ill-appearing.   Eyes:     Conjunctiva/sclera: Conjunctivae normal.     Pupils: Pupils are equal, round, and reactive to light.  Cardiovascular:     Rate and Rhythm: Normal rate.     Pulses: Normal pulses.  Pulmonary:     Effort: Pulmonary effort is normal.     Breath sounds: Normal breath sounds.  Abdominal:     General: Abdomen is flat. Bowel sounds are normal. There is no distension.     Palpations: Abdomen is soft.     Tenderness: There is no abdominal tenderness. There is no right CVA tenderness, left CVA tenderness, guarding or rebound.  Musculoskeletal:        General: Normal range of motion.     Cervical back: Normal range of motion and neck supple.  Skin:    General: Skin is warm and dry.  Neurological:     General: No focal deficit present.     Mental Status: She is alert and oriented to person, place, and time.  Psychiatric:        Mood and Affect: Mood normal.        Thought Content: Thought content normal.        Assessment & Plan:   Problem List Items Addressed This Visit       Genitourinary   Abnormal uterine bleeding (AUB)    Negative pregnancy test. She will call and schedule with OB/GYN. Recent labs done in ED. She had a very thorough work up including CT abdomen pelvis. Treated for gonorrhea in the ED.          Other   Depression    Encouraged her to see a therapist. A list was provided but she is aware she can see someone with her employer since this is covered or whomever she would like to se.        Intermittent right upper quadrant abdominal pain - Primary    Reviewed notes and results from UC and ED.  Negative CT  Suspect her pain may be related to MSK etiology. She may also have GERD and I recommend taking a PPI for the next 4 weeks.         Return if symptoms worsen or fail to improve.   10/01/2021, NP-C

## 2021-08-29 DIAGNOSIS — F32A Depression, unspecified: Secondary | ICD-10-CM | POA: Insufficient documentation

## 2021-08-29 NOTE — Assessment & Plan Note (Addendum)
Negative pregnancy test. She will call and schedule with OB/GYN. Recent labs done in ED. She had a very thorough work up including CT abdomen pelvis. Treated for gonorrhea in the ED.

## 2021-08-29 NOTE — Assessment & Plan Note (Signed)
Encouraged her to see a therapist. A list was provided but she is aware she can see someone with her employer since this is covered or whomever she would like to se.

## 2021-08-29 NOTE — Assessment & Plan Note (Signed)
Reviewed notes and results from UC and ED.  Negative CT  Suspect her pain may be related to MSK etiology. She may also have GERD and I recommend taking a PPI for the next 4 weeks.

## 2021-09-05 ENCOUNTER — Ambulatory Visit: Payer: BC Managed Care – PPO | Admitting: Family Medicine

## 2021-12-12 DIAGNOSIS — Z113 Encounter for screening for infections with a predominantly sexual mode of transmission: Secondary | ICD-10-CM | POA: Diagnosis not present

## 2021-12-12 DIAGNOSIS — Z114 Encounter for screening for human immunodeficiency virus [HIV]: Secondary | ICD-10-CM | POA: Diagnosis not present

## 2022-01-27 DIAGNOSIS — J029 Acute pharyngitis, unspecified: Secondary | ICD-10-CM | POA: Diagnosis not present

## 2022-02-08 ENCOUNTER — Encounter (HOSPITAL_COMMUNITY): Payer: Self-pay

## 2022-02-08 ENCOUNTER — Ambulatory Visit (HOSPITAL_COMMUNITY)
Admission: RE | Admit: 2022-02-08 | Discharge: 2022-02-08 | Disposition: A | Discharge status: Home / Self Care | Payer: BC Managed Care – PPO | Source: Ambulatory Visit | Attending: Internal Medicine | Admitting: Internal Medicine

## 2022-02-08 VITALS — BP 107/68 | Temp 98.6°F | Resp 12

## 2022-02-08 DIAGNOSIS — Z3201 Encounter for pregnancy test, result positive: Secondary | ICD-10-CM

## 2022-02-08 LAB — POCT URINALYSIS DIPSTICK, ED / UC
Glucose, UA: NEGATIVE mg/dL
Hgb urine dipstick: NEGATIVE
Ketones, ur: 40 mg/dL — AB
Leukocytes,Ua: NEGATIVE
Nitrite: NEGATIVE
Protein, ur: >=300 mg/dL — AB
Specific Gravity, Urine: >=1.03 (ref 1.005–1.030)
Urobilinogen, UA: 2 mg/dL — ABNORMAL HIGH (ref 0.0–1.0)
pH: 6 (ref 5.0–8.0)

## 2022-02-08 LAB — POC URINE PREG, ED: Preg Test, Ur: POSITIVE — AB

## 2022-02-08 NOTE — Discharge Instructions (Addendum)
Your pregnancy test is positive.  Start taking the prescribed prenatal vitamin daily.  Refer to the list of safe medications in pregnancy you were given at urgent care today and do not take any other medications over-the-counter other than what is on this list.  You may only take Tylenol for pain and discomfort/fever.  Do not take ibuprofen in pregnancy.   Drink plenty of water (at least 8 cups/day) to stay well-hydrated.   I would like for you to call [facility] to schedule an appointment to set up time to talk with a licensed therapist as I believe that this will help you greatly to process some of what is happening.  [email or web address] [address] [place], [place] [address]  ~2 mi [phone]  If you experience any vaginal bleeding, severe abdominal cramping, low back pain, or other concerning symptoms, please go to the maternity assessment unit located at entrance C of [facility] for further evaluation.

## 2022-02-08 NOTE — ED Provider Notes (Signed)
[facility]    CSN: [id number] Arrival date & time: 02/08/22  1236      History   Chief Complaint Chief Complaint  Patient presents with  . Possible Pregnancy    Entered by patient    HPI Mallory Nelson is a 31 y.o. female.   Presents urgent care for a pregnancy test since she is late for her menstrual cycle.  Last menstrual cycle was January 01, 2022.  She is in an open relationship with her partner of 9 years but recently had unprotected intercourse with a new female partner and the middle of October and is concerned she may be pregnant.  No vaginal bleeding, abdominal pain, low back pain, nausea, vomiting, fever or chills, urinary symptoms, or vaginal symptoms.   Possible Pregnancy   History reviewed. No pertinent past medical history.  Patient Active Problem List   Diagnosis Date Noted  . Depression 08/29/2021  . Intermittent right upper quadrant abdominal pain 08/28/2021  . Abnormal uterine bleeding (AUB) 08/28/2021    Past Surgical History:  Procedure Laterality Date  . CYSTECTOMY Left    Cyst Removal Left Breast  . EYE EXAMINATION UNDER ANESTHESIA Bilateral 04/30/2018   Procedure: GLOBE EXPLORATION , REMOVAL OF FOREIGN BODY (GLASS);  Surgeon: Carmela Rima, MD;  Location: [facility] OR;  Service: Ophthalmology;  Laterality: Bilateral;    OB History   No obstetric history on file.      Home Medications    Prior to Admission medications   Not on File    Family History History reviewed. No pertinent family history.  Social History Social History   Tobacco Use  . Smoking status: Every Day    Packs/day: 1.00    Types: Cigarettes  . Smokeless tobacco: Former  Advertising account planner  . Vaping Use: Former  Substance Use Topics  . Alcohol use: Yes  . Drug use: Yes    Types: Marijuana     Allergies   Latex   Review of Systems Review of Systems   Physical Exam Triage Vital Signs ED Triage Vitals  Enc Vitals Group     BP 02/08/22 1253 107/68      Pulse --      Resp 02/08/22 1253 12     Temp 02/08/22 1253 98.6 F (37 C)     Temp Source 02/08/22 1253 Oral     SpO2 02/08/22 1253 100 %     Weight --      Height --      Head Circumference --      Peak Flow --      Pain Score 02/08/22 1255 0     Pain Loc --      Pain Edu? --      Excl. in GC? --    No data found.  Updated Vital Signs BP 107/68 (BP Location: Right Arm)   Temp 98.6 F (37 C) (Oral)   Resp 12   LMP 01/01/2022   SpO2 100%   Visual Acuity Right Eye Distance:   Left Eye Distance:   Bilateral Distance:    Right Eye Near:   Left Eye Near:    Bilateral Near:     Physical Exam   UC Treatments / Results  Labs (all labs ordered are listed, but only abnormal results are displayed) Labs Reviewed  POC URINE PREG, ED - Abnormal; Notable for the following components:      Result Value   Preg Test, Ur POSITIVE (*)    All other  components within normal limits  POCT URINALYSIS DIPSTICK, ED / UC - Abnormal; Notable for the following components:   Bilirubin Urine SMALL (*)    Ketones, ur 40 (*)    Protein, ur >=300 (*)    Urobilinogen, UA 2.0 (*)    All other components within normal limits    EKG   Radiology No results found.  Procedures Procedures (including critical care time)  Medications Ordered in UC Medications - No data to display  Initial Impression / Assessment and Plan / UC Course  I have reviewed the triage vital signs and the nursing notes.  Pertinent labs & imaging results that were available during my care of the patient were reviewed by me and considered in my medical decision making (see chart for details).     *** Final Clinical Impressions(s) / UC Diagnoses   Final diagnoses:  Positive pregnancy test     Discharge Instructions      Your pregnancy test is positive.  Start taking the prescribed prenatal vitamin daily.  Refer to the list of safe medications in pregnancy you were given at urgent care today and do not  take any other medications over-the-counter other than what is on this list.  You may only take Tylenol for pain and discomfort/fever.  Do not take ibuprofen in pregnancy.   Drink plenty of water (at least 8 cups/day) to stay well-hydrated.   I would like for you to call [facility] to schedule an appointment to set up time to talk with a licensed therapist as I believe that this will help you greatly to process some of what is happening.  [email or web address] [address], [place], [place] [address]  ~2 mi [phone]  If you experience any vaginal bleeding, severe abdominal cramping, low back pain, or other concerning symptoms, please go to the maternity assessment unit located at entrance C of [facility] for further evaluation.   ED Prescriptions   None    PDMP not reviewed this encounter.

## 2022-02-08 NOTE — ED Triage Notes (Signed)
Pt is here for pregnancy test. 

## 2022-02-11 ENCOUNTER — Encounter: Payer: Self-pay | Admitting: Family Medicine

## 2022-02-11 ENCOUNTER — Ambulatory Visit (INDEPENDENT_AMBULATORY_CARE_PROVIDER_SITE_OTHER): Payer: BC Managed Care – PPO | Admitting: Family Medicine

## 2022-02-11 VITALS — BP 100/58 | HR 66 | Temp 98.0°F | Ht 66.0 in | Wt 124.0 lb

## 2022-02-11 DIAGNOSIS — F419 Anxiety disorder, unspecified: Secondary | ICD-10-CM

## 2022-02-11 DIAGNOSIS — Z3201 Encounter for pregnancy test, result positive: Secondary | ICD-10-CM

## 2022-02-11 NOTE — Progress Notes (Signed)
Subjective:     Patient ID: Mallory Nelson, female    DOB: 1990/07/30, 31 y.o.   MRN: [id number]  Chief Complaint  Patient presents with   Anxiety    Mallory Nelson to [facility] Saturday and was told she is pregnant, would like an Ultrasound scheduled to see how far she is. Would also like to discuss therapists (was given a list of some but wants to discuss as she is trying to decide if she would like to go through with the pregnancy)    Anxiety     Patient is in today for positive urine pregnancy test. Would like to have more information in regards to how far along she is currently.  LMP: October 4th.   She is taking amoxicillin for sore throat, this was prescribed at [facility]  Taking a prenatal vitamin  Denies fever, chills, dizziness, chest pain, palpitations, shortness of breath, abdominal pain, N/V/D, urinary symptoms, vaginal discharge.      Health Maintenance Due  Topic Date Due   HIV Screening  Never done   Hepatitis C Screening  Never done   TETANUS/TDAP  01/13/2011   INFLUENZA VACCINE  Never done    History reviewed. No pertinent past medical history.  Past Surgical History:  Procedure Laterality Date   CYSTECTOMY Left    Cyst Removal Left Breast   EYE EXAMINATION UNDER ANESTHESIA Bilateral 04/30/2018   Procedure: GLOBE EXPLORATION , REMOVAL OF FOREIGN BODY (GLASS);  Surgeon: Carmela Rima, MD;  Location: [facility] OR;  Service: Ophthalmology;  Laterality: Bilateral;    History reviewed. No pertinent family history.  Social History   Socioeconomic History   Marital status: Single    Spouse name: Not on file   Number of children: Not on file   Years of education: Not on file   Highest education level: Not on file  Occupational History   Not on file  Tobacco Use   Smoking status: Every Day    Packs/day: 1.00    Types: Cigarettes   Smokeless tobacco: Former  Building services engineer Use: Former  Substance and Sexual Activity   Alcohol use: Yes   Drug use: Yes    Types:  Marijuana   Sexual activity: Yes  Other Topics Concern   Not on file  Social History Narrative   Not on file   Social Determinants of Health   Financial Resource Strain: Not on file  Food Insecurity: Not on file  Transportation Needs: Not on file  Physical Activity: Not on file  Stress: Not on file  Social Connections: Not on file  Intimate Partner Violence: Not on file    Outpatient Medications Prior to Visit  Medication Sig Dispense Refill   amoxicillin (AMOXIL) 500 MG capsule      No facility-administered medications prior to visit.    Allergies  Allergen Reactions   Latex Itching and Swelling    Burning    ROS     Objective:    Physical Exam Constitutional:      General: She is not in acute distress.    Appearance: She is not ill-appearing.  Cardiovascular:     Rate and Rhythm: Normal rate.  Pulmonary:     Effort: Pulmonary effort is normal.  Neurological:     General: No focal deficit present.     Mental Status: She is alert and oriented to person, place, and time.  Psychiatric:        Behavior: Behavior normal.  Thought Content: Thought content normal. Thought content does not include homicidal or suicidal ideation.     BP (!) 100/58 (BP Location: Left Arm, Patient Position: Sitting, Cuff Size: Large)   Pulse 66   Temp 98 F (36.7 C) (Temporal)   Ht 5\' 6"  (1.676 m)   Wt 124 lb (56.2 kg)   LMP 01/01/2022   BMI 20.01 kg/m  Wt Readings from Last 3 Encounters:  02/11/22 124 lb (56.2 kg)  08/28/21 126 lb (57.2 kg)  08/10/21 121 lb (54.9 kg)       Assessment & Plan:   Problem List Items Addressed This Visit   None Visit Diagnoses     Positive urine pregnancy test    -  Primary   Relevant Orders   Beta hCG quant (ref lab)   Anxiety          She reports anxiety related to pregnancy and her current situation. No pain or red flag symptoms. She has a friend with her today.  Quant hCG ordered.  Recommend OB/GYN appt-  a list of  providers given.  I also gave her [facility] information.  A list of counselors also provided.  Discussed good nutrition and hydration and avoiding toxic substances that could harm the pregnancy. She is already taking a prenatal vitamin.  Follow up as needed.   I am having Mallory Meskill "Shay" maintain her amoxicillin.  No orders of the defined types were placed in this encounter.

## 2022-02-11 NOTE — Patient Instructions (Addendum)
[facility]  [address] [place], [place] [address] [phone]  Obgyn Offices:   [facility] [address] [place], [place] [address] [phone]  [facility] Address: [address] [place], [place] [address] Phone: [phone]  [facility] [address] [place], [place] [address] Phone: [phone]  [facility] [address] [place], [place] [address] Phone: [phone]   You can call to schedule your appointment with a counselor  A few offices are listed below for you to call.     [facility] [address] [place], [place] [address]  Phone: [phone]  [facility]  [address], [place], [place] [address]  Phone: [phone]  [facility] (you have this one)  [facility] Multiple locations  Commonly Asked Questions During Pregnancy  Cats: A parasite can be excreted in cat feces.  To avoid exposure you need to have another person empty the little box.  If you must empty the litter box you will need to wear gloves.  Wash your hands after handling your cat.  This parasite can also be found in raw or undercooked meat so this should also be avoided.  Colds, Sore Throats, Flu: Please check your medication sheet to see what you can take for symptoms.  If your symptoms are unrelieved by these medications please call the office.  Dental Work: Most any dental work Agricultural consultant recommends is permitted.  X-rays should only be taken during the first trimester if absolutely necessary.  Your abdomen should be shielded with a lead apron during all x-rays.  Please notify your provider prior to receiving any x-rays.  Novocaine is fine; gas is not recommended.  If your dentist requires a note from [place] prior to dental work please call the office and we will provide one for  you.  Exercise: Exercise is an important part of staying healthy during your pregnancy.  You may continue most exercises you were accustomed to prior to pregnancy.  Later in your pregnancy you will most likely notice you have difficulty with activities requiring balance like riding a bicycle.  It is important that you listen to your body and avoid activities that put you at a higher risk of falling.  Adequate rest and staying well hydrated are a must!  If you have questions about the safety of specific activities ask your provider.    Exposure to Children with illness: Try to avoid obvious exposure; report any symptoms to [place] when noted,  If you have chicken pos, red measles or mumps, you should be immune to these diseases.   Please do not take any vaccines while pregnant unless you have checked with your OB provider.  Fetal Movement: After 28 weeks we recommend you do "kick counts" twice daily.  Lie or sit down in a calm quiet environment and count your baby movements "kicks".  You should feel your baby at least 10 times per hour.  If you have not felt 10 kicks within the first hour get up, walk around and have something sweet to eat or drink then repeat for an additional hour.  If count remains less than 10 per hour notify your provider.  Fumigating: Follow your pest control agent's advice as to how long to stay out of your home.  Ventilate the area well before re-entering.  Hemorrhoids:   Most over-the-counter preparations can be used during  pregnancy.  Check your medication to see what is safe to use.  It is important to use a stool softener or fiber in your diet and to drink lots of liquids.  If hemorrhoids seem to be getting worse please call the office.   Hot Tubs:  Hot tubs Jacuzzis and saunas are not recommended while pregnant.  These increase your internal body temperature and should be avoided.  Intercourse:  Sexual intercourse is safe during pregnancy as long as you are comfortable, unless  otherwise advised by your provider.  Spotting may occur after intercourse; report any bright red bleeding that is heavier than spotting.  Labor:  If you know that you are in labor, please go to the hospital.  If you are unsure, please call the office and let us help you decide what to do.  Lifting, straining, etc:  If your job requires heavy lifting or straining please check with your provider for any limitations.  Generally, you should not lift items heavier than that you can lift simply with your hands and arms (no back muscles)  Painting:  Paint fumes do not harm your pregnancy, but may make you ill and should be avoided if possible.  Latex or water based paints have less odor than oils.  Use adequate ventilation while painting.  Permanents & Hair Color:  Chemicals in hair dyes are not recommended as they cause increase hair dryness which can increase hair loss during pregnancy.  " Highlighting" and permanents are allowed.  Dye may be absorbed differently and permanents may not hold as well during pregnancy.  Sunbathing:  Use a sunscreen, as skin burns easily during pregnancy.  Drink plenty of fluids; avoid over heating.  Tanning Beds:  Because their possible side effects are still unknown, tanning beds are not recommended.  Ultrasound Scans:  Routine ultrasounds are performed at approximately 20 weeks.  You will be able to see your baby's general anatomy an if you would like to know the gender this can usually be determined as well.  If it is questionable when you conceived you may also receive an ultrasound early in your pregnancy for dating purposes.  Otherwise ultrasound exams are not routinely performed unless there is a medical necessity.  Although you can request a scan we ask that you pay for it when conducted because insurance does not cover " patient request" scans.  Work: If your pregnancy proceeds without complications you may work until your due date, unless your physician or employer  advises otherwise.  Round Ligament Pain/Pelvic Discomfort:  Sharp, shooting pains not associated with bleeding are fairly common, usually occurring in the second trimester of pregnancy.  They tend to be worse when standing up or when you remain standing for long periods of time.  These are the result of pressure of certain pelvic ligaments called "round ligaments".  Rest, Tylenol and heat seem to be the most effective relief.  As the womb and fetus grow, they rise out of the pelvis and the discomfort improves.  Please notify the office if your pain seems different than that described.  It may represent a more serious condition.

## 2022-02-12 LAB — BETA HCG QUANT (REF LAB): hCG Quant: 123 m[IU]/mL

## 2022-02-12 LAB — SPECIMEN STATUS REPORT

## 2022-05-16 ENCOUNTER — Encounter: Payer: BC Managed Care – PPO | Admitting: Family Medicine

## 2022-07-18 ENCOUNTER — Encounter: Payer: BC Managed Care – PPO | Admitting: Family Medicine

## 2022-08-01 ENCOUNTER — Encounter: Payer: Self-pay | Admitting: Family Medicine

## 2022-08-01 ENCOUNTER — Ambulatory Visit (INDEPENDENT_AMBULATORY_CARE_PROVIDER_SITE_OTHER): Payer: BC Managed Care – PPO | Admitting: Family Medicine

## 2022-08-01 VITALS — BP 104/60 | HR 76 | Temp 97.8°F | Ht 66.0 in | Wt 128.0 lb

## 2022-08-01 DIAGNOSIS — Z789 Other specified health status: Secondary | ICD-10-CM | POA: Diagnosis not present

## 2022-08-01 DIAGNOSIS — Z0001 Encounter for general adult medical examination with abnormal findings: Secondary | ICD-10-CM

## 2022-08-01 LAB — COMPREHENSIVE METABOLIC PANEL
ALT: 11 U/L (ref 0–35)
AST: 15 U/L (ref 0–37)
Albumin: 4.1 g/dL (ref 3.5–5.2)
Alkaline Phosphatase: 43 U/L (ref 39–117)
BUN: 16 mg/dL (ref 6–23)
CO2: 28 mEq/L (ref 19–32)
Calcium: 8.7 mg/dL (ref 8.4–10.5)
Chloride: 106 mEq/L (ref 96–112)
Creatinine, Ser: 0.96 mg/dL (ref 0.40–1.20)
GFR: 77.76 mL/min (ref >60.00)
Glucose, Bld: 66 mg/dL — ABNORMAL LOW (ref 70–99)
Potassium: 4 mEq/L (ref 3.5–5.1)
Sodium: 141 mEq/L (ref 135–145)
Total Bilirubin: 0.8 mg/dL (ref 0.2–1.2)
Total Protein: 6.8 g/dL (ref 6.0–8.3)

## 2022-08-01 LAB — CBC WITH DIFFERENTIAL/PLATELET
Basophils Absolute: 0.1 10*3/uL (ref 0.0–0.1)
Basophils Relative: 1.2 % (ref 0.0–3.0)
Eosinophils Absolute: 0.2 10*3/uL (ref 0.0–0.7)
Eosinophils Relative: 4 % (ref 0.0–5.0)
HCT: 36.1 % (ref 36.0–46.0)
Hemoglobin: 11.7 g/dL — ABNORMAL LOW (ref 12.0–15.0)
Lymphocytes Relative: 38.1 % (ref 12.0–46.0)
Lymphs Abs: 2.3 10*3/uL (ref 0.7–4.0)
MCHC: 32.5 g/dL (ref 30.0–36.0)
MCV: 85 fl (ref 78.0–100.0)
Monocytes Absolute: 0.5 10*3/uL (ref 0.1–1.0)
Monocytes Relative: 8.7 % (ref 3.0–12.0)
Neutro Abs: 2.9 10*3/uL (ref 1.4–7.7)
Neutrophils Relative %: 48 % (ref 43.0–77.0)
Platelets: 209 10*3/uL (ref 150.0–400.0)
RBC: 4.25 Mil/uL (ref 3.87–5.11)
RDW: 13.7 % (ref 11.5–15.5)
WBC: 6 10*3/uL (ref 4.0–10.5)

## 2022-08-01 LAB — POCT URINE PREGNANCY: Preg Test, Ur: NEGATIVE

## 2022-08-01 NOTE — Progress Notes (Signed)
Complete physical exam  Patient: Mallory Nelson   DOB: Sep 30, 1990   32 y.o. Female  MRN: [id number]  Subjective:    Chief Complaint  Patient presents with   Annual Exam   She is here for a complete physical exam.  She last saw OB/GYN - Dr. Cherly Hensen in November 2023.   LMP: due in 5 days.  Regular cycles   Diet fairly unhealthy  Does yoga and walks her dogs    She works as Social worker.     Health Maintenance  Topic Date Due   HIV Screening  Never done   Hepatitis C Screening: USPSTF Recommendation to screen - Ages 18-79 yo.  Never done   DTaP/Tdap/Td vaccine (2 - Tdap) 01/13/2011   Flu Shot  10/30/2022   Pap Smear  08/10/2024   HPV Vaccine  Aged Out   COVID-19 Vaccine  Discontinued    Wears seatbelt always, uses sunscreen, smoke detectors in home and functioning, does not text while driving, feels safe in home environment.  Depression screening:    08/28/2021    8:36 AM  Depression screen PHQ 2/9  Decreased Interest 1  Down, Depressed, Hopeless 1  PHQ - 2 Score 2  Altered sleeping 2  Tired, decreased energy 1  Change in appetite 1  Feeling bad or failure about yourself  1  Trouble concentrating 3  Moving slowly or fidgety/restless 0  Suicidal thoughts 0  PHQ-9 Score 10  Difficult doing work/chores Somewhat difficult   Anxiety Screening:     No data to display          Vision:Not within last year  and Dental: No current dental problems and No regular dental care   Patient Active Problem List   Diagnosis Date Noted   Depression 08/29/2021   Intermittent right upper quadrant abdominal pain 08/28/2021   Abnormal uterine bleeding (AUB) 08/28/2021   Past Medical History:  Diagnosis Date   Anemia    Anxiety    Arthritis    Past Surgical History:  Procedure Laterality Date   CYSTECTOMY Left    Cyst Removal Left Breast   EYE EXAMINATION UNDER ANESTHESIA Bilateral 04/30/2018   Procedure: GLOBE EXPLORATION , REMOVAL OF FOREIGN BODY (GLASS);   Surgeon: Carmela Rima, MD;  Location: [facility] OR;  Service: Ophthalmology;  Laterality: Bilateral;   EYE SURGERY     Social History   Tobacco Use   Smoking status: Passive Smoke Exposure - Never Smoker   Smokeless tobacco: Former  Building services engineer Use: Former  Substance Use Topics   Alcohol use: Yes    Alcohol/week: 4.0 standard drinks of alcohol    Types: 4 Glasses of wine per week   Drug use: Yes    Frequency: 15.0 times per week    Types: Marijuana      Patient Care Team: Avanell Shackleton, NP-C as PCP - General (Family Medicine)   Outpatient Medications Prior to Visit  Medication Sig   [DISCONTINUED] amoxicillin (AMOXIL) 500 MG capsule  (Patient not taking: Reported on 08/01/2022)   No facility-administered medications prior to visit.    Review of Systems  Constitutional:  Negative for chills, fever, malaise/fatigue and weight loss.  HENT:  Negative for congestion, ear pain, sinus pain and sore throat.   Eyes:  Negative for blurred vision, double vision and pain.  Respiratory:  Negative for cough, shortness of breath and wheezing.   Cardiovascular:  Negative for chest pain, palpitations and leg swelling.  Gastrointestinal:  Negative for abdominal pain, constipation, diarrhea, nausea and vomiting.  Genitourinary:  Negative for dysuria, frequency and urgency.  Musculoskeletal:  Negative for back pain, joint pain and myalgias.  Skin:  Negative for rash.  Neurological:  Negative for dizziness, tingling, focal weakness and headaches.  Endo/Heme/Allergies:  Does not bruise/bleed easily.  Psychiatric/Behavioral:  Negative for depression. The patient is not nervous/anxious.        Objective:    BP 104/60 (BP Location: Left Arm, Patient Position: Sitting, Cuff Size: Large)   Pulse 76   Temp 97.8 F (36.6 C) (Temporal)   Ht 5\' 6"  (1.676 m)   Wt 128 lb (58.1 kg)   LMP 07/12/2022   SpO2 99%   Breastfeeding Unknown   BMI 20.66 kg/m  BP Readings from Last 3 Encounters:   08/01/22 104/60  02/11/22 (!) 100/58  02/08/22 107/68   Wt Readings from Last 3 Encounters:  08/01/22 128 lb (58.1 kg)  02/11/22 124 lb (56.2 kg)  08/28/21 126 lb (57.2 kg)    Physical Exam Constitutional:      General: She is not in acute distress. HENT:     Right Ear: Tympanic membrane, ear canal and external ear normal.     Left Ear: Tympanic membrane, ear canal and external ear normal.     Nose: Nose normal.     Mouth/Throat:     Mouth: Mucous membranes are moist.     Pharynx: Oropharynx is clear.  Eyes:     Extraocular Movements: Extraocular movements intact.     Conjunctiva/sclera: Conjunctivae normal.     Pupils: Pupils are equal, round, and reactive to light.  Neck:     Thyroid: No thyroid mass, thyromegaly or thyroid tenderness.  Cardiovascular:     Rate and Rhythm: Normal rate and regular rhythm.     Pulses: Normal pulses.     Heart sounds: Normal heart sounds.  Pulmonary:     Effort: Pulmonary effort is normal.     Breath sounds: Normal breath sounds.  Abdominal:     General: Bowel sounds are normal.     Palpations: Abdomen is soft.     Tenderness: There is no abdominal tenderness. There is no right CVA tenderness, left CVA tenderness, guarding or rebound.  Musculoskeletal:        General: Normal range of motion.     Cervical back: Normal range of motion and neck supple. No tenderness.     Right lower leg: No edema.     Left lower leg: No edema.  Lymphadenopathy:     Cervical: No cervical adenopathy.  Skin:    General: Skin is warm and dry.     Findings: No lesion or rash.  Neurological:     General: No focal deficit present.     Mental Status: She is alert and oriented to person, place, and time.     Cranial Nerves: No cranial nerve deficit.     Sensory: No sensory deficit.     Motor: No weakness.     Gait: Gait normal.  Psychiatric:        Mood and Affect: Mood normal.        Behavior: Behavior normal.        Thought Content: Thought content  normal.      No results found for any visits on 08/01/22.    Assessment & Plan:    Routine Health Maintenance and Physical Exam  Problem List Items Addressed This Visit   None Visit Diagnoses  Encounter for general adult medical examination with abnormal findings    -  Primary   Relevant Orders   CBC with Differential/Platelet   Comprehensive metabolic panel   Attempting to conceive       Relevant Orders   POCT urine pregnancy      Preventive health care reviewed.  She sees her OB/GYN and UTD with pap smear. Counseling on healthy lifestyle including diet and exercise.  Recommend regular dental and eye exams.  Immunizations reviewed.  Discussed safety.   Return in about 1 year (around 08/01/2023).     Hetty Blend, NP-C

## 2022-11-05 ENCOUNTER — Other Ambulatory Visit: Payer: Self-pay

## 2022-11-05 ENCOUNTER — Encounter (HOSPITAL_COMMUNITY): Payer: Self-pay

## 2022-11-05 ENCOUNTER — Emergency Department (HOSPITAL_COMMUNITY): Payer: BC Managed Care – PPO

## 2022-11-05 ENCOUNTER — Emergency Department (HOSPITAL_COMMUNITY)
Admission: EM | Admit: 2022-11-05 | Discharge: 2022-11-05 | Discharge status: Against Medical Advice | Payer: BC Managed Care – PPO | Attending: Emergency Medicine | Admitting: Emergency Medicine

## 2022-11-05 DIAGNOSIS — Z91040 Latex allergy status: Secondary | ICD-10-CM | POA: Insufficient documentation

## 2022-11-05 DIAGNOSIS — R110 Nausea: Secondary | ICD-10-CM | POA: Insufficient documentation

## 2022-11-05 DIAGNOSIS — R519 Headache, unspecified: Secondary | ICD-10-CM | POA: Diagnosis not present

## 2022-11-05 DIAGNOSIS — H53149 Visual discomfort, unspecified: Secondary | ICD-10-CM | POA: Diagnosis not present

## 2022-11-05 DIAGNOSIS — M545 Low back pain, unspecified: Secondary | ICD-10-CM | POA: Insufficient documentation

## 2022-11-05 DIAGNOSIS — M542 Cervicalgia: Secondary | ICD-10-CM | POA: Insufficient documentation

## 2022-11-05 LAB — URINALYSIS, ROUTINE W REFLEX MICROSCOPIC
Bilirubin Urine: NEGATIVE
Glucose, UA: NEGATIVE mg/dL
Hgb urine dipstick: NEGATIVE
Ketones, ur: 80 mg/dL — AB
Leukocytes,Ua: NEGATIVE
Nitrite: NEGATIVE
Protein, ur: NEGATIVE mg/dL
Specific Gravity, Urine: 1.018 (ref 1.005–1.030)
pH: 5 (ref 5.0–8.0)

## 2022-11-05 LAB — COMPREHENSIVE METABOLIC PANEL
ALT: 17 U/L (ref 0–44)
AST: 18 U/L (ref 15–41)
Albumin: 4.3 g/dL (ref 3.5–5.0)
Alkaline Phosphatase: 40 U/L (ref 38–126)
Anion gap: 15 (ref 5–15)
BUN: 10 mg/dL (ref 6–20)
CO2: 21 mmol/L — ABNORMAL LOW (ref 22–32)
Calcium: 8.8 mg/dL — ABNORMAL LOW (ref 8.9–10.3)
Chloride: 101 mmol/L (ref 98–111)
Creatinine, Ser: 0.85 mg/dL (ref 0.44–1.00)
GFR, Estimated: >60 mL/min (ref >60)
Glucose, Bld: 78 mg/dL (ref 70–99)
Potassium: 3.4 mmol/L — ABNORMAL LOW (ref 3.5–5.1)
Sodium: 137 mmol/L (ref 135–145)
Total Bilirubin: 1.2 mg/dL (ref 0.3–1.2)
Total Protein: 7.4 g/dL (ref 6.5–8.1)

## 2022-11-05 LAB — HCG, SERUM, QUALITATIVE: Preg, Serum: NEGATIVE

## 2022-11-05 LAB — CBC
HCT: 39.9 % (ref 36.0–46.0)
Hemoglobin: 12.9 g/dL (ref 12.0–15.0)
MCH: 28.4 pg (ref 26.0–34.0)
MCHC: 32.3 g/dL (ref 30.0–36.0)
MCV: 87.9 fL (ref 80.0–100.0)
Platelets: 251 10*3/uL (ref 150–400)
RBC: 4.54 MIL/uL (ref 3.87–5.11)
RDW: 14 % (ref 11.5–15.5)
WBC: 7.5 10*3/uL (ref 4.0–10.5)
nRBC: 0 % (ref 0.0–0.2)

## 2022-11-05 LAB — LIPASE, BLOOD: Lipase: 33 U/L (ref 11–51)

## 2022-11-05 MED ORDER — LIDOCAINE 5 % EX PTCH
1.0000 | MEDICATED_PATCH | CUTANEOUS | Status: DC
  Administered 2022-11-05: 1 via TRANSDERMAL
  Filled 2022-11-05: qty 1

## 2022-11-05 MED ORDER — IOHEXOL 350 MG/ML SOLN
75.0000 mL | Freq: Once | INTRAVENOUS | Status: AC | PRN
  Administered 2022-11-05: 75 mL via INTRAVENOUS

## 2022-11-05 MED ORDER — DIPHENHYDRAMINE HCL 50 MG/ML IJ SOLN
25.0000 mg | Freq: Once | INTRAMUSCULAR | Status: AC
  Administered 2022-11-05: 25 mg via INTRAVENOUS
  Filled 2022-11-05: qty 1

## 2022-11-05 MED ORDER — DEXAMETHASONE SODIUM PHOSPHATE 10 MG/ML IJ SOLN
10.0000 mg | Freq: Once | INTRAMUSCULAR | Status: AC
  Administered 2022-11-05: 10 mg via INTRAVENOUS
  Filled 2022-11-05: qty 1

## 2022-11-05 MED ORDER — SODIUM CHLORIDE 0.9 % IV BOLUS
1000.0000 mL | Freq: Once | INTRAVENOUS | Status: AC
  Administered 2022-11-05: 1000 mL via INTRAVENOUS

## 2022-11-05 MED ORDER — KETOROLAC TROMETHAMINE 15 MG/ML IJ SOLN
15.0000 mg | Freq: Once | INTRAMUSCULAR | Status: AC
  Administered 2022-11-05: 15 mg via INTRAVENOUS
  Filled 2022-11-05: qty 1

## 2022-11-05 MED ORDER — METOCLOPRAMIDE HCL 5 MG/ML IJ SOLN
10.0000 mg | Freq: Once | INTRAMUSCULAR | Status: AC
  Administered 2022-11-05: 10 mg via INTRAVENOUS
  Filled 2022-11-05: qty 2

## 2022-11-05 MED ORDER — CYCLOBENZAPRINE HCL 10 MG PO TABS
5.0000 mg | ORAL_TABLET | Freq: Once | ORAL | Status: AC
  Administered 2022-11-05: 5 mg via ORAL
  Filled 2022-11-05: qty 1

## 2022-11-05 NOTE — ED Notes (Signed)
Patient transported to CT 

## 2022-11-05 NOTE — ED Notes (Signed)
Pt stated that she does not want to stay any longer and will be leaving. Pt request for IV to be removed,  PA at bedside talking with pt and explaining risk of leaving prior to getting results. Pt continues to refuse any further treatment and refuses to sign AMA form. Pt ambulated out of ED w steady gait, a&ox4,nad.

## 2022-11-05 NOTE — ED Notes (Signed)
Pt returned from CT °

## 2022-11-05 NOTE — Discharge Instructions (Addendum)
You have chosen to leave AGAINST MEDICAL ADVICE, you understand the risk associated with this.  We have not been able to do procure your CTA head, and you have decided to leave before getting this done.  It is unclear why your headache is so severe, return to the ER if you worsening symptoms.  All imaging that we have been able to do was reassuring.  Please follow-up with your PCP

## 2022-11-05 NOTE — ED Triage Notes (Signed)
Pt arrives via POV. Pt reports headache, back pain, and nausea for the past 3 days. Pt reports hx of migraines but none as severe as this. Pt is AxOx4.

## 2022-11-05 NOTE — ED Provider Notes (Signed)
[place] EMERGENCY DEPARTMENT AT [facility] Provider Note   CSN: [id number] Arrival date & time: 11/05/22  1024     History  Chief Complaint  Patient presents with   Headache   Back Pain   Nausea    Mallory Nelson is a 32 y.o. female, past history of presented to the ED secondary to 2 complaints.  1 being a headache, and the other being low back pain.  She states that she has had a headache, for the last 3 days, and states that it wraps all the way around her head, and goes down her neck.  She denies any kind of vision changes, but does endorse nausea photophobia, and phonophobia.  She does take Goody's powder, ibuprofen, Aleve, without relief.  She notes that she has had a headaches occasionally, but this is the worst headache that she has ever had.  Has progressively gotten worse for the last 4 days.  No associated weakness, changes in speech, or dizziness.  Not on birth control.  Additionally states that she has some low back pain, going from her low back, down her buttocks.  Worse with movement, denies any heavy lifting.  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  Also reports crouching over at times.  And has a desk job, during the day.  She states that she has not had any injuries, no loss of bowel, bladder.  No urinary or vaginal symptoms.  No abdominal pain.  Has utilized ibuprofen, Aleve, and TENS machine without relief.    Home Medications Prior to Admission medications   Not on File      Allergies    Latex    Review of Systems   Review of Systems  Musculoskeletal:  Positive for back pain.  Neurological:  Positive for headaches.    Physical Exam Updated Vital Signs BP 123/65 (BP Location: Right Arm)   Pulse 80   Temp 98.7 F (37.1 C) (Oral)   Resp 16   Ht 5\' 6"  (1.676 m)   Wt 53.1 kg   SpO2 98%   BMI 18.88 kg/m  Physical Exam Vitals and nursing note reviewed.  Constitutional:      General: She is not in  acute distress.    Appearance: She is well-developed.  HENT:     Head: Normocephalic and atraumatic.  Eyes:     General: No visual field deficit.    Conjunctiva/sclera: Conjunctivae normal.  Neck:     Comments: Mild tenderness to paraspinal muscles of cervical spine.  No midline tenderness.  Range of motion intact.  Sensitivity to scalp. Cardiovascular:     Rate and Rhythm: Normal rate and regular rhythm.     Heart sounds: No murmur heard. Pulmonary:     Effort: Pulmonary effort is normal. No respiratory distress.     Breath sounds: Normal breath sounds.  Abdominal:     Palpations: Abdomen is soft.     Tenderness: There is no abdominal tenderness.  Musculoskeletal:        General: No swelling.     Cervical back: Neck supple.     Comments: Tenderness to palpation of bilateral lumbar paraspinal muscles.  Negative straight leg raise bilaterally.  5/5 strength bilateral lower extremities.  No groin anesthesia.  Skin:    General: Skin is warm and dry.     Capillary Refill: Capillary refill takes less than 2 seconds.  Neurological:     Mental Status: She is  alert and oriented to person, place, and time.     Cranial Nerves: No cranial nerve deficit, dysarthria or facial asymmetry.     Sensory: No sensory deficit.     Motor: No weakness.  Psychiatric:        Mood and Affect: Mood normal.     ED Results / Procedures / Treatments   Labs (all labs ordered are listed, but only abnormal results are displayed) Labs Reviewed  COMPREHENSIVE METABOLIC PANEL - Abnormal; Notable for the following components:      Result Value   Potassium 3.4 (*)    CO2 21 (*)    Calcium 8.8 (*)    All other components within normal limits  URINALYSIS, ROUTINE W REFLEX MICROSCOPIC - Abnormal; Notable for the following components:   Ketones, ur 80 (*)    All other components within normal limits  LIPASE, BLOOD  CBC  HCG, SERUM, QUALITATIVE    EKG None  Radiology CT ANGIO HEAD NECK W WO  CM  Result Date: 11/05/2022 CLINICAL DATA:  Intractable headache. EXAM: CT ANGIOGRAPHY NECK WITH AND WITHOUT CONTRAST TECHNIQUE: Multidetector CT imaging of the neck was performed using the standard protocol during bolus administration of intravenous contrast. Multiplanar CT image reconstructions and MIPs were obtained to evaluate the vascular anatomy. Carotid stenosis measurements (when applicable) are obtained utilizing NASCET criteria, using the distal internal carotid diameter as the denominator. RADIATION DOSE REDUCTION: This exam was performed according to the departmental dose-optimization program which includes automated exposure control, adjustment of the mA and/or kV according to patient size and/or use of iterative reconstruction technique. CONTRAST:  75mL OMNIPAQUE IOHEXOL 350 MG/ML SOLN COMPARISON:  None Available. FINDINGS: CTA NECK FINDINGS Aortic arch: Three-vessel arch configuration. Arch vessel origins are patent. Right carotid system: No evidence of dissection, stenosis (50% or greater), or occlusion. Left carotid system: No evidence of dissection, stenosis (50% or greater), or occlusion. Vertebral arteries: Codominant. No evidence of dissection, stenosis (50% or greater), or occlusion. Skeleton: Normal. Other neck: Unremarkable. Upper chest: Unremarkable. Review of the MIP images confirms the above findings IMPRESSION: Normal CTA of the neck. Electronically Signed   By: Orvan Falconer M.D.   On: 11/05/2022 18:08   CT Head Wo Contrast  Result Date: 11/05/2022 CLINICAL DATA:  Headache, sudden, severe EXAM: CT HEAD WITHOUT CONTRAST TECHNIQUE: Contiguous axial images were obtained from the base of the skull through the vertex without intravenous contrast. RADIATION DOSE REDUCTION: This exam was performed according to the departmental dose-optimization program which includes automated exposure control, adjustment of the mA and/or kV according to patient size and/or use of iterative reconstruction  technique. COMPARISON:  04/29/2018 FINDINGS: Brain: No evidence of acute infarction, hemorrhage, hydrocephalus, extra-axial collection or mass lesion/mass effect. Vascular: No hyperdense vessel or unexpected calcification. Skull: Normal. Negative for fracture or focal lesion. Sinuses/Orbits: No acute finding. Other: None. IMPRESSION: No acute intracranial findings. Electronically Signed   By: Duanne Guess D.O.   On: 11/05/2022 14:27    Procedures Procedures    Medications Ordered in ED Medications  lidocaine (LIDODERM) 5 % 1 patch (1 patch Transdermal Patch Applied 11/05/22 1311)  metoCLOPramide (REGLAN) injection 10 mg (10 mg Intravenous Given 11/05/22 1310)  diphenhydrAMINE (BENADRYL) injection 25 mg (25 mg Intravenous Given 11/05/22 1310)  cyclobenzaprine (FLEXERIL) tablet 5 mg (5 mg Oral Given 11/05/22 1311)  sodium chloride 0.9 % bolus 1,000 mL (0 mLs Intravenous Stopped 11/05/22 1515)  ketorolac (TORADOL) 15 MG/ML injection 15 mg (15 mg Intravenous Given 11/05/22 1502)  dexamethasone (DECADRON) injection 10 mg (10 mg Intravenous Given 11/05/22 1502)  iohexol (OMNIPAQUE) 350 MG/ML injection 75 mL (75 mLs Intravenous Contrast Given 11/05/22 1747)    ED Course/ Medical Decision Making/ A&P                                 Medical Decision Making Patient is a 32 year old female, here for headache, back pain, been going on for the last couple days.  She states it is intractable and nothing makes it better.  We will obtain labs, CT head, and attempt medication relief.  Amount and/or Complexity of Data Reviewed Labs: ordered.    Details: No acute findings on labs Radiology: ordered.    Details: CT head, and CTA neck unremarkable Discussion of management or test interpretation with external provider(s): Discussed with patient, CT techs did not perform CTA of head even though it was written to be performed, I recommended that we do this, patient adamantly declined, and is requesting to leave.  I  recommend that she stay, but she was adamant that she went to leave.  We discussed return precautions, and she decided leave AGAINST MEDICAL ADVICE.  All of her imaging was reassuring today that was obtained.  And she was given multiple doses of medicine, for her head and her back.  She is not an IV drug user and has no acute neurodeficits, thus no red flags, to indicate need for imaging.  Risk Prescription drug management.    Final Clinical Impression(s) / ED Diagnoses Final diagnoses:  Acute intractable headache, unspecified headache type  Acute bilateral low back pain without sciatica    Rx / DC Orders ED Discharge Orders     None         , Harley Alto, PA 11/05/22 1821    Loetta Rough, MD 11/06/22 [phone]

## 2022-11-14 ENCOUNTER — Encounter: Payer: Self-pay | Admitting: Family Medicine

## 2022-11-14 ENCOUNTER — Ambulatory Visit: Payer: BC Managed Care – PPO | Admitting: Family Medicine

## 2022-11-14 VITALS — BP 90/58 | HR 90 | Temp 98.0°F | Ht 66.0 in | Wt 125.0 lb

## 2022-11-14 DIAGNOSIS — M545 Low back pain, unspecified: Secondary | ICD-10-CM

## 2022-11-14 DIAGNOSIS — M6289 Other specified disorders of muscle: Secondary | ICD-10-CM

## 2022-11-14 DIAGNOSIS — G44209 Tension-type headache, unspecified, not intractable: Secondary | ICD-10-CM

## 2022-11-14 MED ORDER — METHOCARBAMOL 500 MG PO TABS: 500.0000 mg | ORAL_TABLET | Freq: Every evening | ORAL | 0 refills | Status: DC | PRN

## 2022-11-14 MED ORDER — MELOXICAM 15 MG PO TABS: 15.0000 mg | ORAL_TABLET | Freq: Every day | ORAL | 0 refills | Status: DC

## 2022-11-14 NOTE — Patient Instructions (Signed)
Stop ibuprofen and all over the counter pain medications except Tylenol.   Start meloxicam once daily with food. (Do not take if you think you are pregnant)  Use the muscle relaxant as needed for muscle tightness but be aware it is sedating.   Use a heating pad and topical over the counter pain medications such as Biofreeze or Salon Pas with lidocaine.   Do the back exercises.   Take a multivitamin and a magnesium supplement.   Follow up if not improvement in the next 2-4 weeks

## 2022-11-14 NOTE — Progress Notes (Unsigned)
Subjective:     Patient ID: Mallory Nelson, female    DOB: [birth year], 32 y.o.   MRN: [id number]  Chief Complaint  Patient presents with   Headache    Started last Sunday-Wednesday ongoing felt like someone had hands and squeezing Mallory Nelson head. Hard to sleep and lay down, moving around made it worse. Went to ED on Wednesday 8/7. Thursday-Friday took max doses of ibuprofen which felt like pushed headache to back of head making it manageable, but not gone    HPI  Discussed the use of AI scribe software for clinical note transcription with the patient, who gave verbal consent to proceed.  History of Present Illness          C/o posterior headache.   Taking ibuprofen 600 mg daily      Health Maintenance Due  Topic Date Due   HIV Screening  Never done   Hepatitis C Screening  Never done   DTaP/Tdap/Td (2 - Tdap) 01/13/2011   INFLUENZA VACCINE  10/30/2022    Past Medical History:  Diagnosis Date   Anemia    Anxiety    Arthritis     Past Surgical History:  Procedure Laterality Date   CYSTECTOMY Left    Cyst Removal Left Breast   EYE EXAMINATION UNDER ANESTHESIA Bilateral 04/30/2018   Procedure: GLOBE EXPLORATION , REMOVAL OF FOREIGN BODY (GLASS);  Surgeon: Carmela Rima, MD;  Location: [facility] OR;  Service: Ophthalmology;  Laterality: Bilateral;   EYE SURGERY      Family History  Problem Relation Age of Onset   Arthritis Mother    Diabetes Mother    Obesity Mother    Alcohol abuse Father    Drug abuse Father    Arthritis Maternal Grandfather    Arthritis Maternal Grandmother    Diabetes Maternal Grandmother    Stroke Maternal Grandmother    Alcohol abuse Brother     Social History   Socioeconomic History   Marital status: Single    Spouse name: Not on file   Number of children: Not on file   Years of education: Not on file   Highest education level: Not on file  Occupational History   Not on file  Tobacco Use   Smoking status: Passive Smoke Exposure -  Never Smoker   Smokeless tobacco: Former  Advertising account planner   Vaping status: Every Day  Substance and Sexual Activity   Alcohol use: Yes    Alcohol/week: 4.0 standard drinks of alcohol    Types: 4 Glasses of wine per week   Drug use: Yes    Frequency: 15.0 times per week    Types: Marijuana   Sexual activity: Yes    Birth control/protection: None  Other Topics Concern   Not on file  Social History Narrative   Not on file   Social Determinants of Health   Financial Resource Strain: Not on file  Food Insecurity: Not on file  Transportation Needs: Not on file  Physical Activity: Not on file  Stress: Not on file  Social Connections: Not on file  Intimate Partner Violence: Not on file    Outpatient Medications Prior to Visit  Medication Sig Dispense Refill   Prenatal Vit-Fe Fumarate-FA (PRENATAL VITAMIN PO) Take by mouth.     No facility-administered medications prior to visit.    Allergies  Allergen Reactions   Latex Itching and Swelling    Burning    ROS     Objective:    Physical Exam  BP (!) 90/58 (BP Location: Left Arm, Patient Position: Sitting, Cuff Size: Normal)   Pulse 90   Temp 98 F (36.7 C) (Temporal)   Ht 5\' 6"  (1.676 m)   Wt 125 lb (56.7 kg)   LMP 11/07/2022   SpO2 100%   Breastfeeding No   BMI 20.18 kg/m  Wt Readings from Last 3 Encounters:  11/14/22 125 lb (56.7 kg)  11/05/22 117 lb (53.1 kg)  08/01/22 128 lb (58.1 kg)       Assessment & Plan:   Problem List Items Addressed This Visit   None Visit Diagnoses     Acute non intractable tension-type headache    -  Primary   Relevant Medications   meloxicam (MOBIC) 15 MG tablet   methocarbamol (ROBAXIN) 500 MG tablet   Acute bilateral low back pain without sciatica       Relevant Medications   meloxicam (MOBIC) 15 MG tablet   methocarbamol (ROBAXIN) 500 MG tablet   Muscle tightness       Relevant Medications   methocarbamol (ROBAXIN) 500 MG tablet       I am having Mallory Nelson  "Shay" start on meloxicam and methocarbamol. I am also having Mallory Nelson maintain Mallory Nelson Prenatal Vit-Fe Fumarate-FA (PRENATAL VITAMIN PO).  Meds ordered this encounter  Medications   meloxicam (MOBIC) 15 MG tablet    Sig: Take 1 tablet (15 mg total) by mouth daily.    Dispense:  30 tablet    Refill:  0    Order Specific Question:   Supervising Provider    Answer:   Hillard Danker A [[id number]]   methocarbamol (ROBAXIN) 500 MG tablet    Sig: Take 1 tablet (500 mg total) by mouth at bedtime as needed for muscle spasms.    Dispense:  30 tablet    Refill:  0    Order Specific Question:   Supervising Provider    Answer:   Hillard Danker A [[id number]]

## 2023-09-27 IMAGING — CT CT ABD-PELV W/ CM
2 of 4 series · 15 of 46 positions shown, 17 images · IV contrast (APPLIED)
Comparison: 06/29/2016

CLINICAL DATA: Right lower quadrant abdominal pain

EXAM:
CT ABDOMEN AND PELVIS WITH CONTRAST
TECHNIQUE: Multidetector CT imaging of the abdomen and pelvis was performed
using the standard protocol following bolus administration of
intravenous contrast.

[Series 2: abd pel w · axial · 0.66mm/px · z∈[+934,+1309]mm · 12 of 89 slices shown, 14 images]
[im 7/89  soft-tissue]
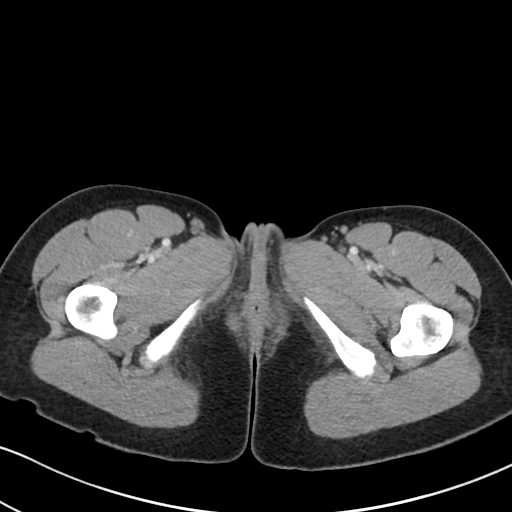
[im 7/89  bone]
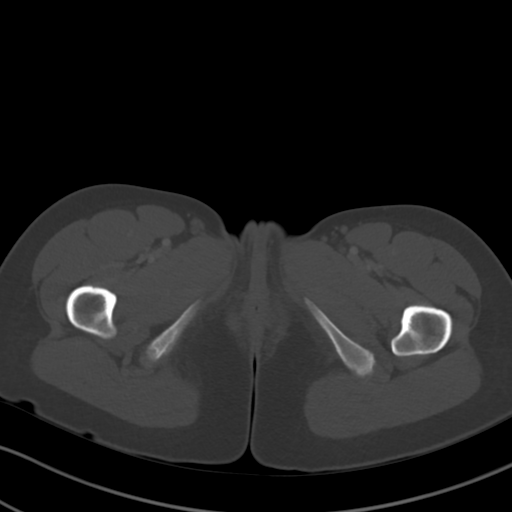
[im 14/89  soft-tissue]
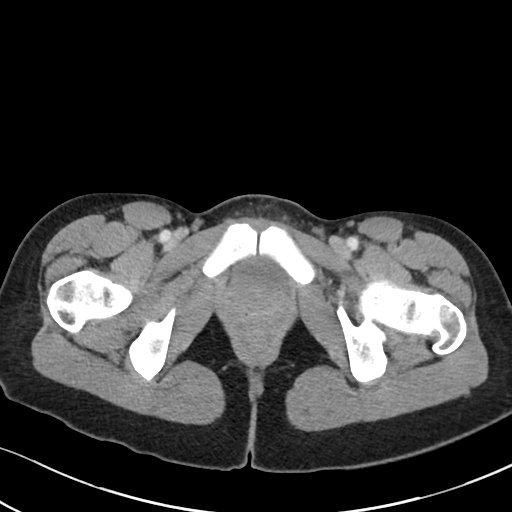
[im 21/89  soft-tissue]
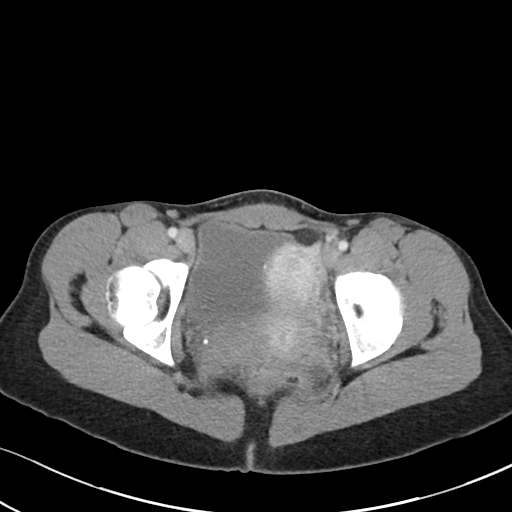
[im 28/89  soft-tissue]
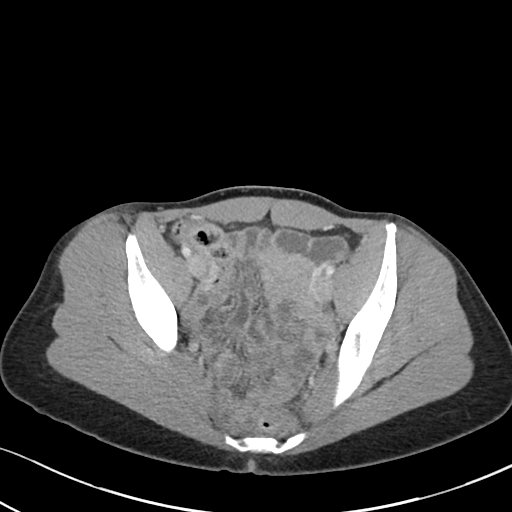
[im 34/89  soft-tissue]
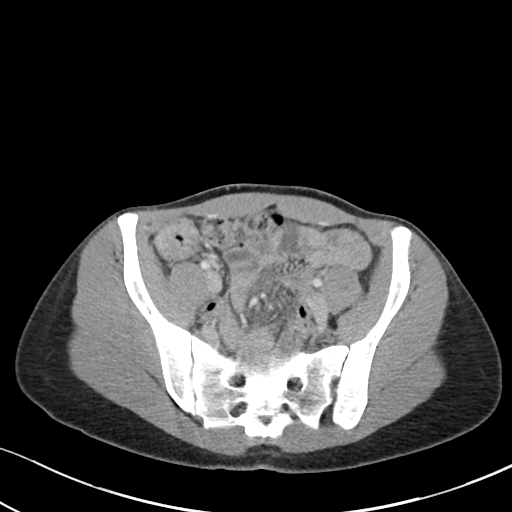
[im 41/89  soft-tissue]
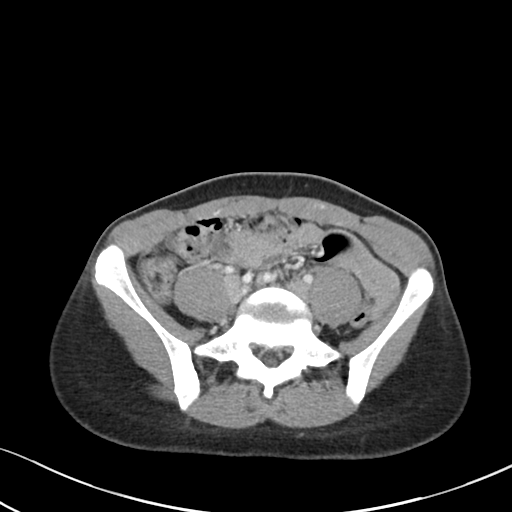
[im 48/89  soft-tissue]
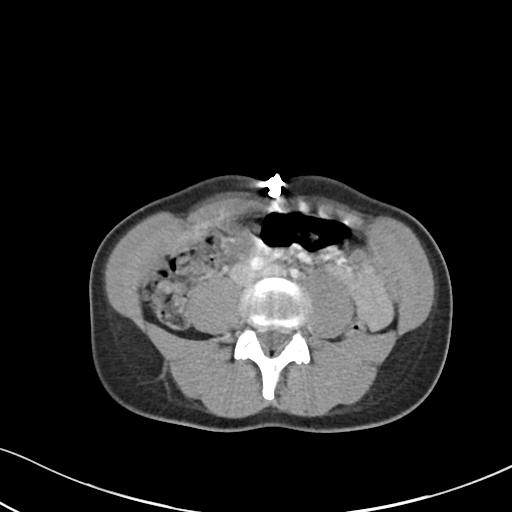
[im 55/89  soft-tissue]
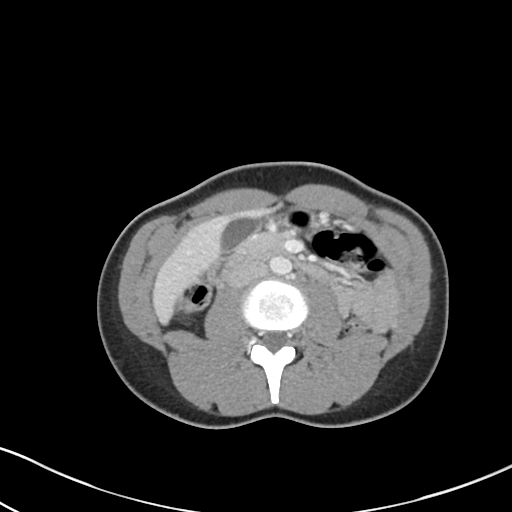
[im 61/89  soft-tissue]
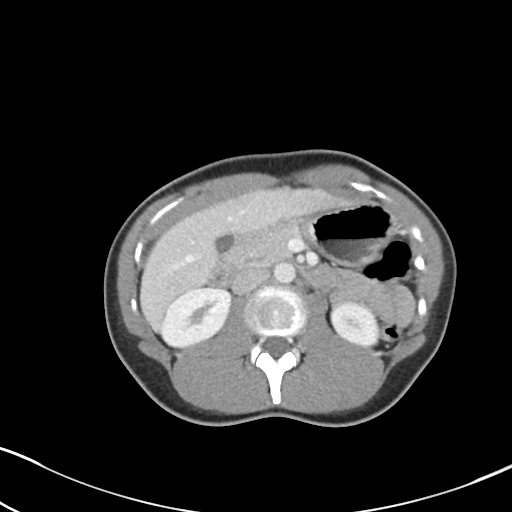
[im 61/89  bone]
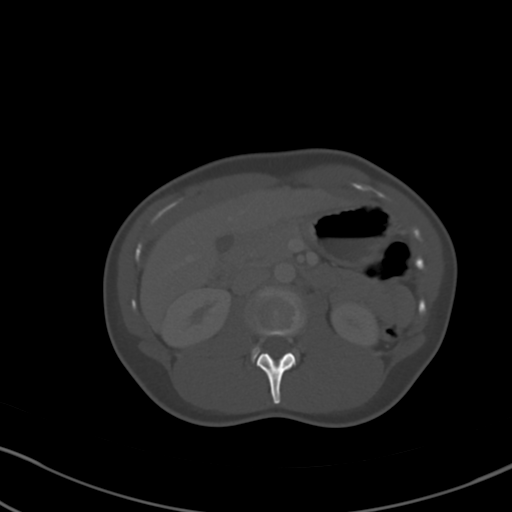
[im 68/89  soft-tissue]
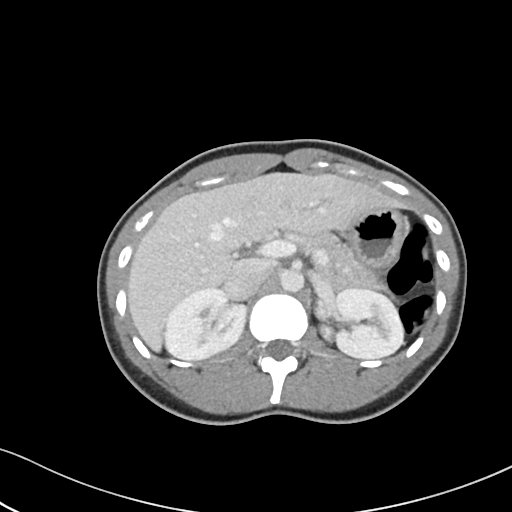
[im 75/89  soft-tissue]
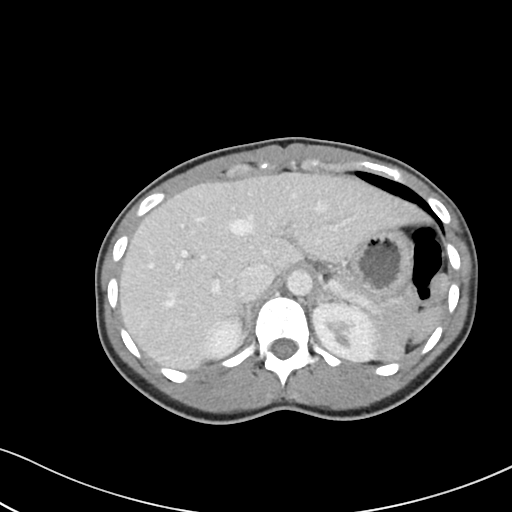
[im 82/89  soft-tissue]
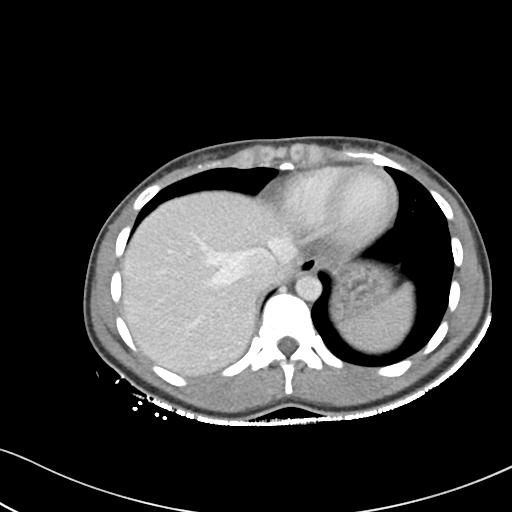

[Series 5: coronal · coronal · 0.61mm/px · 3 of 69 slices shown]
[im 23/69  soft-tissue]
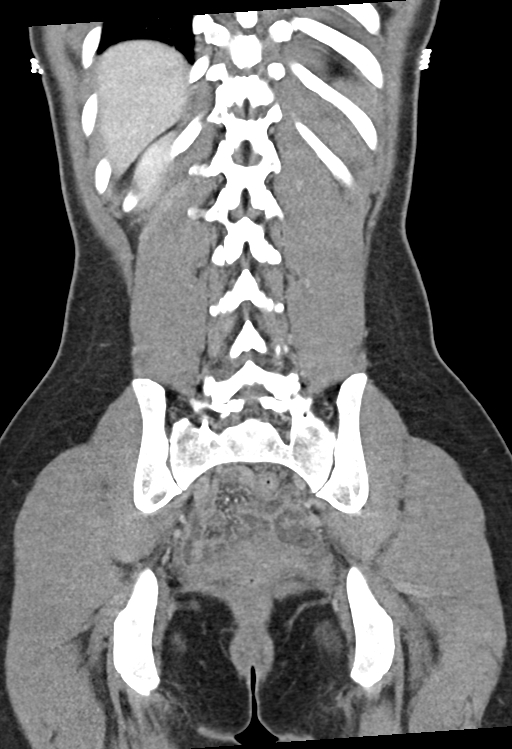
[im 31/69  soft-tissue]
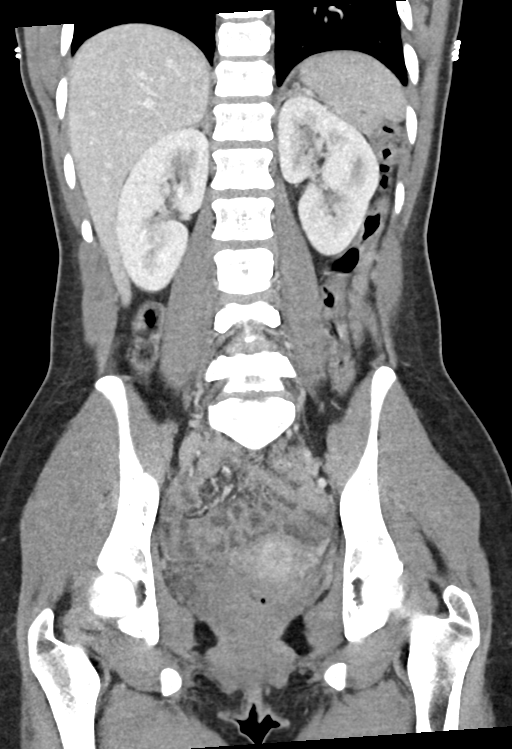
[im 38/69  soft-tissue]
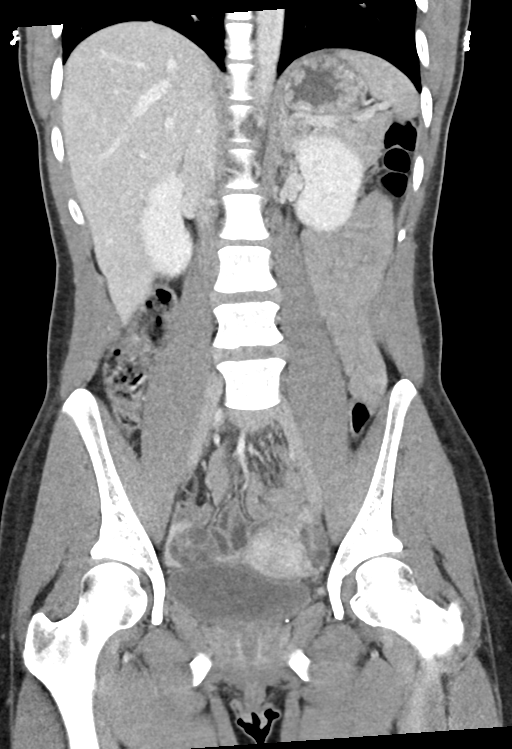

[15 of 46 positions shown; findings below may reference images not displayed]

RADIATION DOSE REDUCTION: This exam was performed according to the
departmental dose-optimization program which includes automated
exposure control, adjustment of the mA and/or kV according to
patient size and/or use of iterative reconstruction technique.

CONTRAST:  100mL OMNIPAQUE IOHEXOL 300 MG/ML  SOLN
FINDINGS: Lower chest: No acute abnormality.

Hepatobiliary: No solid liver abnormality is seen. No gallstones,
gallbladder wall thickening, or biliary dilatation.

Pancreas: Unremarkable. No pancreatic ductal dilatation or
surrounding inflammatory changes.

Spleen: Normal in size without significant abnormality.

Adrenals/Urinary Tract: Adrenal glands are unremarkable. Kidneys are
normal, without renal calculi, solid lesion, or hydronephrosis.
Bladder is unremarkable.

Stomach/Bowel: Stomach is within normal limits. Appendix appears
normal (series 5, image 35). No evidence of bowel wall thickening,
distention, or inflammatory changes.

Vascular/Lymphatic: No significant vascular findings are present. No
enlarged abdominal or pelvic lymph nodes.

Reproductive: No mass or other significant abnormality. Multiple
small bilateral ovarian follicles.

Other: No abdominal wall hernia or abnormality. No ascites.

Musculoskeletal: No acute or significant osseous findings.
IMPRESSION: No acute CT findings of the abdomen or pelvis to explain right lower
quadrant pain. Normal appendix.

## 2024-04-13 ENCOUNTER — Ambulatory Visit: Admitting: Family Medicine

## 2024-04-21 ENCOUNTER — Ambulatory Visit: Admitting: Family Medicine

## 2024-04-21 ENCOUNTER — Ambulatory Visit: Payer: Self-pay | Admitting: Family Medicine

## 2024-04-21 VITALS — BP 100/64 | HR 80 | Temp 97.8°F | Ht 66.0 in | Wt 134.0 lb

## 2024-04-21 DIAGNOSIS — Z13220 Encounter for screening for lipoid disorders: Secondary | ICD-10-CM

## 2024-04-21 DIAGNOSIS — E876 Hypokalemia: Secondary | ICD-10-CM

## 2024-04-21 DIAGNOSIS — Z0001 Encounter for general adult medical examination with abnormal findings: Secondary | ICD-10-CM | POA: Diagnosis not present

## 2024-04-21 DIAGNOSIS — Z1329 Encounter for screening for other suspected endocrine disorder: Secondary | ICD-10-CM

## 2024-04-21 DIAGNOSIS — Z124 Encounter for screening for malignant neoplasm of cervix: Secondary | ICD-10-CM

## 2024-04-21 DIAGNOSIS — D649 Anemia, unspecified: Secondary | ICD-10-CM | POA: Diagnosis not present

## 2024-04-21 DIAGNOSIS — L2989 Other pruritus: Secondary | ICD-10-CM

## 2024-04-21 DIAGNOSIS — Z23 Encounter for immunization: Secondary | ICD-10-CM

## 2024-04-21 DIAGNOSIS — Z1159 Encounter for screening for other viral diseases: Secondary | ICD-10-CM

## 2024-04-21 LAB — FERRITIN: Ferritin: 60.8 ng/mL (ref 10.0–291.0)

## 2024-04-21 LAB — CBC WITH DIFFERENTIAL/PLATELET
Basophils Absolute: 0 K/uL (ref 0.0–0.1)
Basophils Relative: 0.8 % (ref 0.0–3.0)
Eosinophils Absolute: 0.2 K/uL (ref 0.0–0.7)
Eosinophils Relative: 4.6 % (ref 0.0–5.0)
HCT: 37 % (ref 36.0–46.0)
Hemoglobin: 12.3 g/dL (ref 12.0–15.0)
Lymphocytes Relative: 44.2 % (ref 12.0–46.0)
Lymphs Abs: 2 K/uL (ref 0.7–4.0)
MCHC: 33.2 g/dL (ref 30.0–36.0)
MCV: 84.7 fl (ref 78.0–100.0)
Monocytes Absolute: 0.5 K/uL (ref 0.1–1.0)
Monocytes Relative: 10.5 % (ref 3.0–12.0)
Neutro Abs: 1.9 K/uL (ref 1.4–7.7)
Neutrophils Relative %: 39.9 % — ABNORMAL LOW (ref 43.0–77.0)
Platelets: 220 K/uL (ref 150.0–400.0)
RBC: 4.37 Mil/uL (ref 3.87–5.11)
RDW: 13.8 % (ref 11.5–15.5)
WBC: 4.6 K/uL (ref 4.0–10.5)

## 2024-04-21 LAB — LIPID PANEL
Cholesterol: 135 mg/dL (ref 28–200)
HDL: 60.4 mg/dL
LDL Cholesterol: 64 mg/dL (ref 10–99)
NonHDL: 74.2
Total CHOL/HDL Ratio: 2
Triglycerides: 51 mg/dL (ref 10.0–149.0)
VLDL: 10.2 mg/dL (ref 0.0–40.0)

## 2024-04-21 LAB — COMPREHENSIVE METABOLIC PANEL WITH GFR
ALT: 9 U/L (ref 3–35)
AST: 14 U/L (ref 5–37)
Albumin: 4.3 g/dL (ref 3.5–5.2)
Alkaline Phosphatase: 45 U/L (ref 39–117)
BUN: 16 mg/dL (ref 6–23)
CO2: 30 meq/L (ref 19–32)
Calcium: 9 mg/dL (ref 8.4–10.5)
Chloride: 106 meq/L (ref 96–112)
Creatinine, Ser: 0.97 mg/dL (ref 0.40–1.20)
GFR: 75.88 mL/min
Glucose, Bld: 93 mg/dL (ref 70–99)
Potassium: 3.9 meq/L (ref 3.5–5.1)
Sodium: 141 meq/L (ref 135–145)
Total Bilirubin: 0.4 mg/dL (ref 0.2–1.2)
Total Protein: 7.1 g/dL (ref 6.0–8.3)

## 2024-04-21 LAB — FOLATE: Folate: 8 ng/mL

## 2024-04-21 LAB — TSH: TSH: 1.23 u[IU]/mL (ref 0.35–5.50)

## 2024-04-21 LAB — HIV ANTIBODY (ROUTINE TESTING W REFLEX)
HIV 1&2 Ab, 4th Generation: NONREACTIVE
HIV FINAL INTERPRETATION: NEGATIVE

## 2024-04-21 LAB — HEPATITIS C ANTIBODY: Hepatitis C Ab: NONREACTIVE

## 2024-04-21 LAB — VITAMIN B12: Vitamin B-12: 459 pg/mL (ref 211–911)

## 2024-04-21 NOTE — Progress Notes (Signed)
 "  Complete physical exam  Patient: Mallory Nelson   DOB: 02-Aug-1990   34 y.o. Female  MRN: [id number]  Subjective:    Chief Complaint  Patient presents with   Annual Exam   She is here for a complete physical exam.   Discussed the use of AI scribe software for clinical note transcription with the patient, who gave verbal consent to proceed.  History of Present Illness Mallory Nelson is a 34 year old female who presents for a preventive health care physical exam.  Gynecologic health - Overdue for Pap smear; has not seen a gynecologist in years - Menstrual cycles are regular but duration has shortened to approximately four days  Hematologic and metabolic concerns - History of mild anemia and slightly low potassium - Fasting today for blood work including complete blood count, potassium, cholesterol, and thyroid screening  Dermatologic symptoms - Chronic pruritic rash with gradual worsening - History of childhood eczema - Uses steroid cream approximately once daily when remembered, in addition to lotion - Concerned about possible psoriasis  Musculoskeletal symptoms - Chronic back pain - Uses a back brace for support - Performs yoga once weekly for back strengthening  General health and lifestyle - 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 - Stable energy levels despite night work schedule - No change in bowel habits - No urinary symptoms - Eating a cleaner diet with increased fruit intake - Minimal alcohol consumption, approximately one to two glasses of wine occasionally        Health Maintenance  Topic Date Due   HIV Screening  Never done   Hepatitis C Screening  Never done   Pap with HPV screening  Never done   Flu Shot  06/28/2024*   DTaP/Tdap/Td vaccine (8 - Td or Tdap) 04/21/2034   HPV Vaccine (No Doses Required) Completed   Pneumococcal Vaccine  Aged Out   Meningitis B Vaccine  Aged Out   Hepatitis B Vaccine   Discontinued   COVID-19 Vaccine  Discontinued  *Topic was postponed. The date shown is not the original due date.     Depression screening:    04/21/2024    9:24 AM 08/28/2021    8:36 AM  Depression screen PHQ 2/9  Decreased Interest 0 1  Down, Depressed, Hopeless 0 1  PHQ - 2 Score 0 2  Altered sleeping  2  Tired, decreased energy  1  Change in appetite  1  Feeling bad or failure about yourself   1  Trouble concentrating  3  Moving slowly or fidgety/restless  0  Suicidal thoughts  0  PHQ-9 Score  10   Difficult doing work/chores  Somewhat difficult     Data saved with a previous flowsheet row definition   Anxiety Screening:     No data to display           Patient Care Team: Lendia Boby CROME, NP-C as PCP - General (Family Medicine)   Show/hide medication list[1]  Review of Systems  Constitutional:  Negative for chills and fever.  HENT:  Negative for congestion, ear pain, sinus pain and sore throat.   Eyes:  Negative for blurred vision, double vision and pain.  Respiratory:  Negative for cough, shortness of breath and wheezing.   Cardiovascular:  Negative for chest pain, palpitations and leg swelling.  Gastrointestinal:  Negative for abdominal pain, constipation, diarrhea, nausea and vomiting.  Genitourinary:  Negative for dysuria,  frequency and urgency.  Musculoskeletal:  Positive for back pain. Negative for joint pain and myalgias.  Skin:  Positive for itching and rash.       Chronic ongoing rash of legs  Neurological:  Negative for dizziness, tingling, focal weakness and headaches.  Endo/Heme/Allergies:  Does not bruise/bleed easily.  Psychiatric/Behavioral:  Negative for depression. The patient is not nervous/anxious.        Objective:    BP 100/64   Pulse 80   Temp 97.8 F (36.6 C) (Temporal)   Ht 5' 6 (1.676 m)   Wt 134 lb (60.8 kg)   SpO2 99%   BMI 21.63 kg/m  BP Readings from Last 3 Encounters:  04/21/24 100/64  11/14/22 (!) 90/58   11/05/22 123/65   Wt Readings from Last 3 Encounters:  04/21/24 134 lb (60.8 kg)  11/14/22 125 lb (56.7 kg)  11/05/22 117 lb (53.1 kg)    Physical Exam Constitutional:      General: She is not in acute distress.    Appearance: She is not ill-appearing.  HENT:     Right Ear: Tympanic membrane, ear canal and external ear normal.     Left Ear: Tympanic membrane, ear canal and external ear normal.     Nose: Nose normal.     Mouth/Throat:     Mouth: Mucous membranes are moist.     Pharynx: Oropharynx is clear.  Eyes:     Extraocular Movements: Extraocular movements intact.     Conjunctiva/sclera: Conjunctivae normal.     Pupils: Pupils are equal, round, and reactive to light.  Neck:     Thyroid: No thyroid mass, thyromegaly or thyroid tenderness.  Cardiovascular:     Rate and Rhythm: Normal rate and regular rhythm.     Pulses: Normal pulses.     Heart sounds: Normal heart sounds.  Pulmonary:     Effort: Pulmonary effort is normal.     Breath sounds: Normal breath sounds.  Abdominal:     General: Bowel sounds are normal.     Palpations: Abdomen is soft.     Tenderness: There is no abdominal tenderness. There is no right CVA tenderness, left CVA tenderness, guarding or rebound.  Musculoskeletal:        General: Normal range of motion.     Cervical back: Normal range of motion and neck supple. No tenderness.     Right lower leg: No edema.     Left lower leg: No edema.  Lymphadenopathy:     Cervical: No cervical adenopathy.  Skin:    General: Skin is warm and dry.     Findings: Rash present. No lesion.     Comments: Hyperpigmentation and pruritic rash of bilateral anterior lower legs  Neurological:     General: No focal deficit present.     Mental Status: She is alert and oriented to person, place, and time.     Cranial Nerves: No cranial nerve deficit.     Sensory: No sensory deficit.     Motor: No weakness.     Gait: Gait normal.  Psychiatric:        Mood and Affect:  Mood normal.        Behavior: Behavior normal.        Thought Content: Thought content normal.      No results found for any visits on 04/21/24.    Assessment & Plan:    Routine Health Maintenance and Physical Exam Problem List Items Addressed This Visit  None Visit Diagnoses       Encounter for general adult medical examination with abnormal findings    -  Primary     Encounter for screening for other viral diseases       Relevant Orders   Hepatitis C antibody   HIV Antibody (routine testing w rflx)     Mild anemia       Relevant Orders   CBC with Differential/Platelet   Comprehensive metabolic panel with GFR   Ferritin   Folate   Vitamin B12     Hypokalemia       Relevant Orders   Comprehensive metabolic panel with GFR     Screening for cervical cancer       Relevant Orders   Ambulatory referral to Obstetrics / Gynecology     Screening for lipid disorders       Relevant Orders   Lipid panel     Screening for thyroid disorder       Relevant Orders   TSH     Chronic pruritic rash in adult       Relevant Orders   Ambulatory referral to Dermatology     Immunization due       Relevant Orders   Tdap vaccine greater than or equal to 7yo IM (Completed)       Assessment and Plan Assessment & Plan Chronic dermatitis Worsening symptoms, including itching and rash. Eczema since childhood. Differential diagnosis includes psoriasis. - Referred to dermatology for further evaluation and management. - Advised to use eczema lotion as needed. - Cautioned against prolonged use of steroid cream.  Low back pain Chronic low back pain managed with yoga and standing on a tape. - Continue yoga and standing on a tape for back strengthening.  Mild anemia Mild anemia, previously noted. - Ordered blood work to check for anemia.  Hypokalemia Mild hypokalemia, previously noted. - Ordered blood work to check potassium levels.  General Health Maintenance Routine health  maintenance discussed, including screenings and vaccinations. Tetanus vaccine is overdue. - Ordered blood work for cholesterol and thyroid screening. - Ordered HIV and hepatitis C screening. - Administered tetanus vaccine and advised on potential side effects such as soreness, redness, and warmth at the injection site. - Referred to OB GYN for Pap smear and pelvic exam. - Advised to schedule dental and eye appointments.     No follow-ups on file.     Boby Mackintosh, NP-C      [1]  Outpatient Medications Prior to Visit  Medication Sig   Prenatal Vit-Fe Fumarate-FA (PRENATAL VITAMIN PO) Take by mouth.   [DISCONTINUED] meloxicam  (MOBIC ) 15 MG tablet Take 1 tablet (15 mg total) by mouth daily.   [DISCONTINUED] methocarbamol  (ROBAXIN ) 500 MG tablet Take 1 tablet (500 mg total) by mouth at bedtime as needed for muscle spasms.   No facility-administered medications prior to visit.   "

## 2024-04-21 NOTE — Patient Instructions (Signed)
 Please go downstairs for labs.   I referred you to [facility] and to an OB/GYN   I will be in touch with your lab results.

## 2024-05-12 ENCOUNTER — Encounter: Admitting: Obstetrics and Gynecology
